# Patient Record
Sex: Male | Born: 1965 | Race: White | Hispanic: No | Marital: Single | Smoking: Former smoker
Health system: Southern US, Community
[De-identification: ages and names within clinical notes are randomized; demographics above are authoritative.]

## PROBLEM LIST (undated history)

## (undated) DIAGNOSIS — F419 Anxiety disorder, unspecified: Secondary | ICD-10-CM

## (undated) DIAGNOSIS — A809 Acute poliomyelitis, unspecified: Secondary | ICD-10-CM

## (undated) DIAGNOSIS — M1990 Unspecified osteoarthritis, unspecified site: Secondary | ICD-10-CM

## (undated) DIAGNOSIS — E119 Type 2 diabetes mellitus without complications: Secondary | ICD-10-CM

## (undated) DIAGNOSIS — E7800 Pure hypercholesterolemia, unspecified: Secondary | ICD-10-CM

## (undated) DIAGNOSIS — K5732 Diverticulitis of large intestine without perforation or abscess without bleeding: Secondary | ICD-10-CM

## (undated) DIAGNOSIS — E669 Obesity, unspecified: Secondary | ICD-10-CM

## (undated) HISTORY — PX: EYE SURGERY: SHX253

## (undated) HISTORY — DX: Unspecified osteoarthritis, unspecified site: M19.90

## (undated) HISTORY — DX: Acute poliomyelitis, unspecified: A80.9

## (undated) HISTORY — DX: Obesity, unspecified: E66.9

## (undated) HISTORY — DX: Pure hypercholesterolemia, unspecified: E78.00

## (undated) HISTORY — DX: Type 2 diabetes mellitus without complications: E11.9

## (undated) HISTORY — DX: Diverticulitis of large intestine without perforation or abscess without bleeding: K57.32

## (undated) HISTORY — DX: Anxiety disorder, unspecified: F41.9

## (undated) HISTORY — PX: COLON SURGERY: SHX602

---
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
## 2000-04-20 ENCOUNTER — Emergency Department (HOSPITAL_COMMUNITY): Admission: EM | Admit: 2000-04-20 | Discharge: 2000-04-20 | Payer: Self-pay | Admitting: Emergency Medicine

## 2000-04-20 ENCOUNTER — Encounter: Payer: Self-pay | Admitting: Emergency Medicine

## 2003-07-19 ENCOUNTER — Encounter: Admission: RE | Admit: 2003-07-19 | Discharge: 2003-07-19 | Payer: Self-pay | Admitting: Gastroenterology

## 2003-08-15 ENCOUNTER — Ambulatory Visit (HOSPITAL_COMMUNITY): Admission: RE | Admit: 2003-08-15 | Discharge: 2003-08-15 | Payer: Self-pay | Admitting: Gastroenterology

## 2003-08-15 ENCOUNTER — Encounter (INDEPENDENT_AMBULATORY_CARE_PROVIDER_SITE_OTHER): Payer: Self-pay | Admitting: *Deleted

## 2005-04-14 ENCOUNTER — Emergency Department (HOSPITAL_COMMUNITY): Admission: EM | Admit: 2005-04-14 | Discharge: 2005-04-14 | Payer: Self-pay | Admitting: Emergency Medicine

## 2007-01-06 ENCOUNTER — Encounter: Admission: RE | Admit: 2007-01-06 | Discharge: 2007-01-06 | Payer: Self-pay | Admitting: Gastroenterology

## 2008-05-25 HISTORY — PX: HEMICOLECTOMY: SHX854

## 2008-06-06 ENCOUNTER — Encounter (INDEPENDENT_AMBULATORY_CARE_PROVIDER_SITE_OTHER): Payer: Self-pay | Admitting: General Surgery

## 2008-06-06 ENCOUNTER — Inpatient Hospital Stay (HOSPITAL_COMMUNITY): Admission: RE | Admit: 2008-06-06 | Discharge: 2008-06-12 | Payer: Self-pay | Admitting: General Surgery

## 2009-06-04 ENCOUNTER — Emergency Department (HOSPITAL_COMMUNITY): Admission: EM | Admit: 2009-06-04 | Discharge: 2009-06-04 | Payer: Self-pay | Admitting: Emergency Medicine

## 2010-08-10 LAB — CBC
Hemoglobin: 15.6 g/dL (ref 13.0–17.0)
MCHC: 34.7 g/dL (ref 30.0–36.0)
MCV: 85.4 fL (ref 78.0–100.0)
RBC: 5.24 MIL/uL (ref 4.22–5.81)
RDW: 13.1 % (ref 11.5–15.5)

## 2010-08-10 LAB — BASIC METABOLIC PANEL
CO2: 24 mEq/L (ref 19–32)
Chloride: 105 mEq/L (ref 96–112)
Creatinine, Ser: 0.9 mg/dL (ref 0.4–1.5)
GFR calc Af Amer: >60 mL/min (ref >60)
Glucose, Bld: 114 mg/dL — ABNORMAL HIGH (ref 70–99)
Sodium: 138 mEq/L (ref 135–145)

## 2010-08-10 LAB — RAPID URINE DRUG SCREEN, HOSP PERFORMED
Barbiturates: NOT DETECTED
Benzodiazepines: NOT DETECTED
Cocaine: POSITIVE — AB

## 2010-08-10 LAB — URINALYSIS, ROUTINE W REFLEX MICROSCOPIC
Glucose, UA: NEGATIVE mg/dL
Ketones, ur: 15 mg/dL — AB
Nitrite: NEGATIVE
pH: 5 (ref 5.0–8.0)

## 2010-08-10 LAB — DIFFERENTIAL
Basophils Absolute: 0 10*3/uL (ref 0.0–0.1)
Basophils Relative: 0 % (ref 0–1)
Eosinophils Absolute: 0 10*3/uL (ref 0.0–0.7)
Monocytes Absolute: 0.4 10*3/uL (ref 0.1–1.0)
Monocytes Relative: 4 % (ref 3–12)

## 2010-09-08 LAB — CBC
HCT: 36.1 % — ABNORMAL LOW (ref 39.0–52.0)
HCT: 44.4 % (ref 39.0–52.0)
HCT: 34.3 % — ABNORMAL LOW (ref 39.0–52.0)
Hemoglobin: 12.5 g/dL — ABNORMAL LOW (ref 13.0–17.0)
MCHC: 34.2 g/dL (ref 30.0–36.0)
MCV: 85.8 fL (ref 78.0–100.0)
MCV: 85.1 fL (ref 78.0–100.0)
Platelets: 190 10*3/uL (ref 150–400)
Platelets: 190 10*3/uL (ref 150–400)
RDW: 13.1 % (ref 11.5–15.5)
RDW: 13.4 % (ref 11.5–15.5)
WBC: 8.3 10*3/uL (ref 4.0–10.5)

## 2010-09-08 LAB — DIFFERENTIAL
Basophils Absolute: 0 10*3/uL (ref 0.0–0.1)
Lymphocytes Relative: 43 % (ref 12–46)
Lymphs Abs: 2.7 10*3/uL (ref 0.7–4.0)
Monocytes Absolute: 0.4 10*3/uL (ref 0.1–1.0)
Monocytes Relative: 7 % (ref 3–12)
Neutro Abs: 2.8 10*3/uL (ref 1.7–7.7)

## 2010-09-08 LAB — URINALYSIS, ROUTINE W REFLEX MICROSCOPIC
Bilirubin Urine: NEGATIVE
Glucose, UA: NEGATIVE mg/dL
Nitrite: NEGATIVE
Specific Gravity, Urine: 1.027 (ref 1.005–1.030)
pH: 5.5 (ref 5.0–8.0)

## 2010-09-08 LAB — BASIC METABOLIC PANEL
BUN: 5 mg/dL — ABNORMAL LOW (ref 6–23)
Chloride: 107 mEq/L (ref 96–112)
GFR calc non Af Amer: >60 mL/min (ref >60)
Glucose, Bld: 117 mg/dL — ABNORMAL HIGH (ref 70–99)
Glucose, Bld: 128 mg/dL — ABNORMAL HIGH (ref 70–99)
Potassium: 4.3 mEq/L (ref 3.5–5.1)
Potassium: 4.5 mEq/L (ref 3.5–5.1)
Sodium: 137 mEq/L (ref 135–145)

## 2010-09-08 LAB — COMPREHENSIVE METABOLIC PANEL
Albumin: 4.2 g/dL (ref 3.5–5.2)
BUN: 11 mg/dL (ref 6–23)
Calcium: 9.6 mg/dL (ref 8.4–10.5)
Creatinine, Ser: 0.97 mg/dL (ref 0.4–1.5)
Total Bilirubin: 0.6 mg/dL (ref 0.3–1.2)
Total Protein: 6.9 g/dL (ref 6.0–8.3)

## 2010-10-07 NOTE — Op Note (Signed)
NAMEKINGDOM, VANZANTEN              ACCOUNT NO.:  [id number]   MEDICAL RECORD NO.:  [id number]          PATIENT TYPE:  INP   LOCATION:  NA                           FACILITY:  [facility]   PHYSICIAN:  Sharlet Salina T. Hoxworth, M.D.DATE OF BIRTH:  1966-05-02   DATE OF PROCEDURE:  06/06/2008  DATE OF DISCHARGE:                               OPERATIVE REPORT   PREOPERATIVE DIAGNOSIS:  Diverticulitis, left colon.   POSTOPERATIVE DIAGNOSIS:  Diverticulitis, left colon.   SURGICAL PROCEDURES:  Laparoscopy and open left hemicolectomy with take-  down of splenic flexure.   SURGEON:  Lorne Skeens. Hoxworth, M.D.   ASSISTANT:  Dr. Felicity Pellegrini   ANESTHESIA:  General.   BRIEF HISTORY:  Clayton Thompson is a 45 year old male who has been having  recurrent episodes of diverticulitis for at least 15 years.  He has  begun to have more frequent episodes with left lower quadrant pain and  documented sigmoid diverticulitis by CT.  Over the last year he has been  on almost constant courses of oral antibiotics and will have a flare-up  almost as soon as he is off.  He had a colonoscopy within the last 2  years, consistent with diverticulitis of the left colon.  CT scan,  January 06, 2008, showed sigmoid colon diverticulitis without abscess or  other complication.  After discussion in the office, we have elected to  proceed with colectomy to control his symptoms and complications.  We  will approach this initially laparoscopically.  The nature of the  procedure, its indications, risks of anesthetic complications, bleeding,  infection, anastomotic leak and possible need for open procedure were  discussed and understood.  He is now brought to the operating room for  this procedure.   DESCRIPTION OF OPERATION:  The patient was brought to the operating  room, placed in supine position on the operating table and general  endotracheal anesthesia was induced.  He was carefully positioned,  padded in lithotomy  position.  Foley catheter was placed.  Abdomen and  perineum were widely sterilely prepped and draped.  He had received  preoperative broad-spectrum antibiotics.  Mechanical and antibiotic  bowel prep had been done at home.  Correct patient and procedure were  verified.   Access was obtained with a 5 mm OptiVu trocar in the right lateral  abdomen without difficulty and pneumoperitoneum established.  Under  direct vision an 11-mm trocar was placed in the midline above the  umbilicus, another 5-mm trocar in the left lateral abdomen.  There was  very significant diverticulitis involving the sigmoid colon with a very  thickened, inflamed-appearing sigmoid colon and inflammatory adhesions  to the lateral and anterior abdominal wall.  Examination of the left  colon laparoscopically also showed significant diverticulosis extending  up to and involving the splenic flexure and there appeared to be  muscular thickening of the majority of the proximal left colon.  There  was relatively normal-appearing distal rectosigmoid.   Initially I approached the mesentery of the sigmoid colon medially,  incising the peritoneum at the base of the mesentery on the medial  aspect from  the sacral promontory up along the sigmoid and left colon.  Dissection was then carried into the retroperitoneum, mobilizing the  mesentery up out of the retroperitoneum.  However, the tissue was very  thick, appeared somewhat friable and chronically inflamed and the  dissection planes were really difficult to identify.  I therefore  elected to mobilize the colon laterally and inflammatory adhesions and  then more peritoneal attachments were taken down laterally with cautery  and the sigmoid was mobilized toward the midline.  This dissection was  carried up toward the left colon.  A gel hand port was placed in the low  midline through a 7-cm incision to allow more mobilization of the  proximal left colon.  We then went up to the  splenic flexure where there  was still some significant diverticulosis and I felt at this point we  would need to resect him back to the midtransverse colon.  The lesser  sac was opened with the LigaSure and the omentum mobilized up off the  distal transverse colon.  We then worked on mobilizing the splenic  flexure laparoscopically.  However, this was very difficult with a very  high splenic flexure that actually extended up behind the spleen.  As we  rotated the spleen up there were attachments that really extended almost  up toward the diaphragm.  We worked on this for some time and achieved  some mobilization under direct vision, but I eventually felt that we  really could not see or manipulate the tissue well enough to safely  mobilize the distal transverse colon and splenic flexure.  Also the  dissection around the sigmoid mesentery had not been completed and  appeared to be quite difficult and I felt at this point that converting  to open procedure was wisest.   Therefore the Gel Port was removed and the incision was extended in the  midline up above the umbilicus.  Following this, the colon was further  mobilized, completely taking down the splenic flexure under direct  vision with the cautery and careful blunt dissection and the distal  transverse colon fully mobilized, dividing some attachments to the  lesser sac.  The left colon was further mobilized, working distally, and  finally the rectosigmoid was fully mobilized up out of the  retroperitoneum.  The ureter was identified and protected.  We  identified the distal sigmoid or rectosigmoid where the tinea began to  splay where the tissue was very soft.  We did not see any further  diverticula.  This was chosen for the distal point of resection.  I  chose the midtransverse colon just distal to the middle colic vessels  for the proximal point of resection and this was cleaned of mesenteric  pericolic fat, divided with GIA  stapler.  The mesentery of the distal  transverse left sigmoid colon was then sequentially divided with the  LigaSure.  Larger vessels were additionally clamped and tied proximally.  The ureter was again identified as we worked distally and dissection was  carried down to the previously-marked normal rectosigmoid at which point  the bowel was divided between [employer] clamp and the specimen  removed.   The retroperitoneum was inspected for hemostasis.  We then brought down  the transverse colon with anastomosis which reached very nicely.  I  elected to do a Baker type anastomosis with full-thickness 2-0 inverting  silks between the side of the transverse colon and the end of the  rectosigmoid.  This was  accomplished nicely with a widely patent  anastomosis with healthy-appearing tissue with good blood supply and  there was no tension.  At completion of anastomosis, gloves and  instruments were changed and the abdomen was copiously irrigated with  several liters of saline and the mesentery, retroperitoneum and left  upper quadrant were carefully inspected for hemostasis which appeared  adequate.   The viscera were returned to anatomic position with the transverse colon  coming down, lying nicely lateral and posterior to the small bowel.  The  omentum was brought down over the viscera and the midline fascia was  closed with running looped #1 PDS, begun at either end of the incision  and tied centrally.  Subcutaneous tissue was irrigated.  Skin closed  with staples.  Sponge and needle counts correct.  The patient was taken  to recovery in good condition.      Lorne Skeens. Hoxworth, M.D.  Electronically Signed     BTH/MEDQ  D:  06/06/2008  T:  06/06/2008  Job:  [id number]

## 2010-10-10 NOTE — Discharge Summary (Signed)
NAMEROHAN, JUENGER              ACCOUNT NO.:  [id number]   MEDICAL RECORD NO.:  [id number]          PATIENT TYPE:  INP   LOCATION:  1529                         FACILITY:  [facility]   PHYSICIAN:  Sharlet Salina T. Hoxworth, M.D.DATE OF BIRTH:  12/20/1965   DATE OF ADMISSION:  06/06/2008  DATE OF DISCHARGE:  06/12/2008                               DISCHARGE SUMMARY   DISCHARGE DIAGNOSIS:  Chronic diverticulitis, left colon.   SURGICAL PROCEDURE:  Left hemicolectomy, June 06, 2008.   HISTORY OF PRESENT ILLNESS:  Clayton Thompson is a 45 year old male  followed by Dr. Evette Cristal for abdominal pain and diverticulitis.  This has  been an intermittent problem for him over the last 10 or 15 years.  He  has been treated for innumerable episodes of lower abdominal pain with  outpatient antibiotics which have gradually become more frequent and  severe.  He now is being treated every couple of months and has some  chronic ongoing discomfort even between acute episodes.  Colonoscopy by  Dr. Evette Cristal 2 years ago showed findings consistent with diverticulitis.  CT scan of the abdomen, January 06, 2008, showed sigmoid colon  diverticulitis without abscess or complication.  His bowel movements  have been generally reasonably normal.  No fever or chills but has  persistent left lower quadrant discomfort.  After consultation in the  office and discussion of options, we elected to proceed with elective  laparoscopic or open colectomy.   PAST MEDICAL HISTORY:  1. 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  2. He had a growth plate removed in the right leg but no other      surgery.   No current medications.  NO ALLERGIES.   SOCIAL:  See detailed H and P.   FAMILY HISTORY:  See detailed H and P.   REVIEW OF SYSTEMS:  See detailed H and P.   HOSPITAL COURSE:  Patient was admitted on the morning of his procedure  following mechanical and antibiotic bowel prep at home.  He underwent  laparoscopic exploration with findings of some diverticulosis,  diverticulitis, and thickening of the colon extending well up the left  side of the colon.  Due to his body habitus and extensive disease, this  was technically difficult and converted to an open procedure.  He  underwent a complete left hemicolectomy with anastomosis.  Pain control  and mobility were somewhat difficult postoperatively but he did not have  any significant complications.  He did have a moderate ileus with some  bloating and distention for the first several days.  He was on sips of  clear liquids.  By the 4th day, he had had a bowel movement, his nausea  was improved.  He was advanced to a full liquid diet.  By the 5th day,  he had had further bowel movements and felt much better.  Abdomen was  soft.  He was advanced to a regular diet.  He tolerated this well.  He  is felt ready for discharge on June 12, 2008, the 6th postoperative  day.  Wound is healing primarily.  Abdomen  is soft and nontender.   DISCHARGE MEDICATIONS:  Tylox for pain.   PATHOLOGY:  Revealed diverticulosis and diverticulitis.   FOLLOWUP:  My office in 2 weeks.      Lorne Skeens. Hoxworth, M.D.  Electronically Signed     BTH/MEDQ  D:  07/09/2008  T:  07/09/2008  Job:  [id number]   cc:   Graylin Shiver, M.D.  Fax: [phone]

## 2010-10-10 NOTE — Op Note (Signed)
NAME:  Clayton Thompson, Clayton Thompson                        ACCOUNT NO.:  [id number]   MEDICAL RECORD NO.:  [id number]                   PATIENT TYPE:  AMB   LOCATION:  ENDO                                 FACILITY:  [facility]   PHYSICIAN:  Graylin Shiver, M.D.                DATE OF BIRTH:  [birth year]   DATE OF PROCEDURE:  08/15/2003  DATE OF DISCHARGE:                                 OPERATIVE REPORT   PROCEDURE:  Colonoscopy with polypectomy.   INDICATIONS:  This patient is a 45 year old male who has had recurrent  episodes of left lower quadrant abdominal pain.  He was recently diagnosed  with diverticulitis and has been treated with antibiotics.  Colonoscopy is  being done to further evaluate the colon.   Informed consent was obtained after explanation of the risks of bleeding,  infection, and perforation.   MEDICATIONS:  Fentanyl 100 mcg IV, Versed 10 mg IV.   PROCEDURE:  With the patient in the left lateral decubitus position, a  rectal exam was performed.  No masses were felt.  The Olympus colonoscope  was inserted into the rectum and advanced around the colon to the cecum.  Cecal landmarks were identified.  The cecum and ascending colon were normal.  The transverse colon was normal.  The descending colon and sigmoid revealed  diverticulosis.  These were most extensive in the sigmoid region. In the  distal descending colon there was a 5-6 mm slightly pedunculated polyp that  was snared and removed by snare cautery technique.  The polyp was retrieved,  the cautery site looked good.  The rectum looked normal.  He tolerated the  procedure well without complications.   IMPRESSION:  1. Diverticulosis of the sigmoid and descending colon.  I saw no evidence of     severe active diverticulitis at this time.  2. Distal descending colon polyp.   PLAN:  The pathology will be checked.  The patient will be followed up in  the office.                                               Graylin Shiver,  M.D.    Germain Osgood  D:  08/15/2003  T:  08/16/2003  Job:  [id number]   cc:   L. Lupe Carney, M.D.  [address] [place]  [place] [address]  Fax: [phone]

## 2011-01-07 ENCOUNTER — Emergency Department (HOSPITAL_COMMUNITY)
Admission: EM | Admit: 2011-01-07 | Discharge: 2011-01-08 | Disposition: A | Discharge status: Home / Self Care | Payer: Self-pay | Attending: Emergency Medicine | Admitting: Emergency Medicine

## 2011-01-07 DIAGNOSIS — R079 Chest pain, unspecified: Secondary | ICD-10-CM | POA: Insufficient documentation

## 2011-01-07 DIAGNOSIS — M79609 Pain in unspecified limb: Secondary | ICD-10-CM | POA: Insufficient documentation

## 2011-01-07 DIAGNOSIS — F3289 Other specified depressive episodes: Secondary | ICD-10-CM | POA: Insufficient documentation

## 2011-01-07 DIAGNOSIS — R55 Syncope and collapse: Secondary | ICD-10-CM | POA: Insufficient documentation

## 2011-01-07 DIAGNOSIS — F329 Major depressive disorder, single episode, unspecified: Secondary | ICD-10-CM | POA: Insufficient documentation

## 2011-01-07 LAB — CBC
MCHC: 35.8 g/dL (ref 30.0–36.0)
RDW: 13.2 % (ref 11.5–15.5)

## 2011-01-08 ENCOUNTER — Emergency Department (HOSPITAL_COMMUNITY): Payer: Self-pay

## 2011-01-08 LAB — COMPREHENSIVE METABOLIC PANEL
ALT: 52 U/L (ref 0–53)
Albumin: 4.2 g/dL (ref 3.5–5.2)
Alkaline Phosphatase: 62 U/L (ref 39–117)
BUN: 14 mg/dL (ref 6–23)
Potassium: 4.2 mEq/L (ref 3.5–5.1)
Sodium: 141 mEq/L (ref 135–145)
Total Protein: 7.6 g/dL (ref 6.0–8.3)

## 2011-01-08 LAB — CK TOTAL AND CKMB (NOT AT ARMC)
Relative Index: 1.6 (ref 0.0–2.5)
Total CK: 280 U/L — ABNORMAL HIGH (ref 7–232)

## 2011-01-08 LAB — TROPONIN I: Troponin I: <0.3 ng/mL (ref <0.30)

## 2013-06-02 ENCOUNTER — Other Ambulatory Visit: Payer: Self-pay | Admitting: Family Medicine

## 2013-06-02 DIAGNOSIS — R1032 Left lower quadrant pain: Secondary | ICD-10-CM

## 2013-06-07 ENCOUNTER — Ambulatory Visit
Admission: RE | Admit: 2013-06-07 | Discharge: 2013-06-07 | Disposition: A | Discharge status: Home / Self Care | Payer: No Typology Code available for payment source | Source: Ambulatory Visit | Attending: Family Medicine | Admitting: Family Medicine

## 2013-06-07 DIAGNOSIS — R1032 Left lower quadrant pain: Secondary | ICD-10-CM

## 2013-06-07 MED ORDER — IOHEXOL 300 MG/ML  SOLN
125.0000 mL | Freq: Once | INTRAMUSCULAR | Status: AC | PRN
  Administered 2013-06-07: 125 mL via INTRAVENOUS

## 2014-01-10 ENCOUNTER — Ambulatory Visit (HOSPITAL_COMMUNITY): Payer: BC Managed Care – PPO | Attending: Cardiovascular Disease | Admitting: Radiology

## 2014-01-10 VITALS — BP 138/95 | HR 64 | Ht 66.0 in | Wt 222.0 lb

## 2014-01-10 DIAGNOSIS — Z87891 Personal history of nicotine dependence: Secondary | ICD-10-CM | POA: Diagnosis not present

## 2014-01-10 DIAGNOSIS — R42 Dizziness and giddiness: Secondary | ICD-10-CM | POA: Insufficient documentation

## 2014-01-10 DIAGNOSIS — R079 Chest pain, unspecified: Secondary | ICD-10-CM

## 2014-01-10 MED ORDER — TECHNETIUM TC 99M SESTAMIBI GENERIC - CARDIOLITE
30.0000 | Freq: Once | INTRAVENOUS | Status: AC | PRN
  Administered 2014-01-10: 30 via INTRAVENOUS

## 2014-01-10 MED ORDER — TECHNETIUM TC 99M SESTAMIBI GENERIC - CARDIOLITE
10.0000 | Freq: Once | INTRAVENOUS | Status: AC | PRN
  Administered 2014-01-10: 10 via INTRAVENOUS

## 2014-01-10 MED ORDER — REGADENOSON 0.4 MG/5ML IV SOLN
0.4000 mg | Freq: Once | INTRAVENOUS | Status: AC
  Administered 2014-01-10: 0.4 mg via INTRAVENOUS

## 2014-01-10 NOTE — Progress Notes (Signed)
[facility] SITE 3 NUCLEAR MED [address] [place], [place] [address] [phone]    Cardiology Nuclear Med Study  Clayton JulyRobert T Thompson is a 48 y.o. male     MRN : [id number]     DOB: 16-Apr-1966  Procedure Date: 01/10/2014  Nuclear Med Background Indication for Stress Test:  Evaluation for Ischemia History:  No known CAD, CT showed narrowing cardiac arteries Cardiac Risk Factors: History of Smoking  Symptoms:  Chest Pain, Chest Pain with Exertion (last date of chest discomfort was earlier this morning) and Dizziness   Nuclear Pre-Procedure Caffeine/Decaff Intake:  None> 12 hrs NPO After: 7:45am   Lungs:  clear O2 Sat: 97% on room air. IV 0.9% NS with Angio Cath:  22g  IV Site: R Antecubital x 1, tolerated well IV Started by:  Irean HongPatsy Edwards, RN  Chest Size (in):  48 Cup Size: n/a  Height: 5\' 6"  (1.676 m)  Weight:  222 lb (100.699 kg)  BMI:  Body mass index is 35.85 kg/(m^2). Tech Comments: N/A    Nuclear Med Study 1 or 2 day study: 1 day  Stress Test Type:  Lexiscan  Reading MD: N/A  Order Authorizing Provider:  Lupe Carneyean Mitchell, MD  Resting Radionuclide: Technetium 2633m Sestamibi  Resting Radionuclide Dose: 11.0 mCi   Stress Radionuclide:  Technetium 5233m Sestamibi  Stress Radionuclide Dose: 33.0 mCi           Stress Protocol Rest HR: 64 Stress HR: 95  Rest BP: 138/95 Stress BP: 140/87  Exercise Time (min): n/a METS: n/a           Dose of Adenosine (mg):  n/a Dose of Lexiscan: 0.4 mg  Dose of Atropine (mg): n/a Dose of Dobutamine: n/a mcg/kg/min (at max HR)  Stress Test Technologist: Nelson ChimesSharon Brooks, BS-ES  Nuclear Technologist:  Harlow AsaElizabeth Young, CNMT     Rest Procedure:  Myocardial perfusion imaging was performed at rest 45 minutes following the intravenous administration of Technetium 1933m Sestamibi. Rest ECG: NSR - Normal EKG  Stress Procedure:  The patient received IV Lexiscan 0.4 mg over 15-seconds.  Technetium 3733m Sestamibi injected at 30-seconds.   Quantitative spect images were obtained after a 45 minute delay.  During the infusion of Lexiscan the patient complained of SOB, headache, dizziness and nausea.  These symptoms began to resolve in recovery.  Stress ECG: No significant change from baseline ECG  QPS Raw Data Images:  Mild diaphragmatic attenuation.  Normal left ventricular size. Stress Images:  There is decreased uptake in the inferior wall. Rest Images:  There is decreased uptake in the inferior wall. Subtraction (SDS):  There is a fixed inferior defect with a small area of reversibility in the inferoapex that is most consistent with variations in diaphragmatic attenuation between rest and stress images but cannot rule out a small are of ischemia. Transient Ischemic Dilatation (Normal <1.22):  1.34 Lung/Heart Ratio (Normal <0.45):  0.35  Quantitative Gated Spect Images QGS EDV:  133 ml QGS ESV:  73 ml  Impression Exercise Capacity:  Lexiscan with no exercise. BP Response:  Normal blood pressure response. Clinical Symptoms:  There is dyspnea. ECG Impression:  No significant ST segment change suggestive of ischemia. Comparison with Prior Nuclear Study: No images to compare  Overall Impression:  Intermediate risk stress nuclear study Fixed inferior defect with a small are of reversibility in the inferoapex most consistent with variations in diaphragmatic attenuation but cannot rule out a small area of ischemia. There is also evidence of  transient ischemic dilatation worrisome for multivessel CAD.Marland Kitchen  LV Ejection Fraction: 45%.  LV Wall Motion:  Mildly reduced LV Function; NL Wall Motion   Signed: Armanda Magic, MD

## 2014-01-24 ENCOUNTER — Ambulatory Visit: Payer: BC Managed Care – PPO | Admitting: Cardiology

## 2014-03-27 ENCOUNTER — Encounter: Payer: Self-pay | Admitting: Cardiology

## 2014-03-27 ENCOUNTER — Other Ambulatory Visit: Payer: Self-pay | Admitting: Cardiology

## 2014-03-27 ENCOUNTER — Other Ambulatory Visit: Payer: Self-pay

## 2014-03-27 ENCOUNTER — Ambulatory Visit (INDEPENDENT_AMBULATORY_CARE_PROVIDER_SITE_OTHER): Payer: BC Managed Care – PPO | Admitting: Cardiology

## 2014-03-27 VITALS — BP 134/90 | HR 66 | Ht 67.0 in | Wt 229.2 lb

## 2014-03-27 DIAGNOSIS — R079 Chest pain, unspecified: Secondary | ICD-10-CM

## 2014-03-27 DIAGNOSIS — R9439 Abnormal result of other cardiovascular function study: Secondary | ICD-10-CM

## 2014-03-27 DIAGNOSIS — I2510 Atherosclerotic heart disease of native coronary artery without angina pectoris: Secondary | ICD-10-CM | POA: Insufficient documentation

## 2014-03-27 DIAGNOSIS — Z01812 Encounter for preprocedural laboratory examination: Secondary | ICD-10-CM

## 2014-03-27 LAB — CBC WITH DIFFERENTIAL/PLATELET
Basophils Absolute: 0 10*3/uL (ref 0.0–0.1)
Basophils Relative: 0.4 % (ref 0.0–3.0)
Eosinophils Absolute: 0.3 10*3/uL (ref 0.0–0.7)
Eosinophils Relative: 3.9 % (ref 0.0–5.0)
HEMATOCRIT: 46.9 % (ref 39.0–52.0)
Hemoglobin: 15.6 g/dL (ref 13.0–17.0)
LYMPHS ABS: 2.7 10*3/uL (ref 0.7–4.0)
Lymphocytes Relative: 35.2 % (ref 12.0–46.0)
MCHC: 33.2 g/dL (ref 30.0–36.0)
MCV: 86 fl (ref 78.0–100.0)
MONOS PCT: 6 % (ref 3.0–12.0)
Monocytes Absolute: 0.5 10*3/uL (ref 0.1–1.0)
NEUTROS PCT: 54.5 % (ref 43.0–77.0)
Neutro Abs: 4.3 10*3/uL (ref 1.4–7.7)
PLATELETS: 213 10*3/uL (ref 150.0–400.0)
RBC: 5.46 Mil/uL (ref 4.22–5.81)
RDW: 13.7 % (ref 11.5–15.5)
WBC: 7.8 10*3/uL (ref 4.0–10.5)

## 2014-03-27 LAB — APTT: aPTT: 32.1 s (ref 23.4–32.7)

## 2014-03-27 LAB — BASIC METABOLIC PANEL
BUN: 13 mg/dL (ref 6–23)
CALCIUM: 9.5 mg/dL (ref 8.4–10.5)
CO2: 27 meq/L (ref 19–32)
Chloride: 106 mEq/L (ref 96–112)
Creatinine, Ser: 0.9 mg/dL (ref 0.4–1.5)
GFR: 91.54 mL/min (ref >60.00)
Glucose, Bld: 93 mg/dL (ref 70–99)
Potassium: 5.3 mEq/L — ABNORMAL HIGH (ref 3.5–5.1)
SODIUM: 139 meq/L (ref 135–145)

## 2014-03-27 LAB — PROTIME-INR
INR: 1 ratio (ref 0.8–1.0)
PROTHROMBIN TIME: 10.6 s (ref 9.6–13.1)

## 2014-03-27 NOTE — Progress Notes (Signed)
[address] [place], [place]  [address] Phone: [phone] Fax:  [phone]  Date:  03/27/2014   ID:  Clayton JulyRobert T Labrosse, DOB 1965/08/08, MRN [id number]  PCP:  No primary care provider on file.  Cardiologist:  Armanda Magicraci Khiley Lieser, MD    History of Present Illness: Clayton Thompson is a 48 y.o. male with no prior cardiac history who is referred here today for evaluation from PCP.  He apparently had a CT scan done last January to evaluate abdominal pain and it showed coronary artery calcifications.  He was told at that time to come in to discuss with his PCP but did not.  He was seen in July by his PCP and was complaining of exertional chest pain and dizziness.  A nuclear stress test was ordered which was done 01/10/2014.  This was intermediate risk with a fixed inferior defect with a very small area of reversibility in the inferoapex that was felt to most likely represent variations in diaphragmatic attenuation but could not rule out a very small area of ischemia.  There was also evidence of transient ischemic dilatation.  EF was noted to be 45%.  His PCP now has referred him for further cardiac workup.  He says that he will get a chest discomfort that is a pressure and tooth ache over his left breast that occurs a few times weekly and usually lasts about 30  Minutes to an hour and then resolves.  He denies any SOB but occasionally will get diaphoresis.     Wt Readings from Last 3 Encounters:  03/27/14 229 lb 3.2 oz (103.964 kg)  01/10/14 222 lb (100.699 kg)     No past medical history on file.  Current Outpatient Prescriptions  Medication Sig Dispense Refill  . ALPRAZolam (XANAX) 0.25 MG tablet Take 0.25 mg by mouth daily as needed.  0   No current facility-administered medications for this visit.    Allergies:   No Known Allergies  Social History:  The patient     Family History:  The patient's family history is not on file.   ROS:  Please see the history of present illness.       All other systems reviewed and negative.   PHYSICAL EXAM: VS:  Ht 5\' 7"  (1.702 m)  Wt 229 lb 3.2 oz (103.964 kg)  BMI 35.89 kg/m2 Well nourished, well developed, in no acute distress HEENT: normal Neck: no JVD Cardiac:  normal S1, S2; RRR; no murmur Lungs:  clear to auscultation bilaterally, no wheezing, rhonchi or rales Abd: soft, nontender, no hepatomegaly Ext: no edema Skin: warm and dry Neuro:  CNs 2-12 intact, no focal abnormalities noted  EKG: NSR with no ST changes      ASSESSMENT AND PLAN:  1.  Chest pain with intermediate risk nuclear stress test with intermediate risk stress nuclear study with fixed inferior defect with a small are of reversibility in the inferoapex most consistent with variations in diaphragmatic attenuation but could not rule out a small area of ischemia. There was also evidence of transient ischemic dilatation worrisome for multivessel CAD.  His CRF include remote tobacco use, male sex and age.  I am concerned about his symptoms and with abnormal stress test and coronary artery calcifications noted on chest CT, I have recommended that we proceed with cardiac cath.  We will schedule for this week.  He will continue on ASA.  Cardiac catheterization was discussed with the patient fully including risks on myocardial  infarction, death, stroke, bleeding, arrhythmia, dye allergy, renal insufficiency or bleeding.  All patient questions and concerns were discussed and the patient understands and is willing to proceed.   2.  Coronary artery calcifications on chest CT 3.  Remote history of tobacco  Signed, Armanda Magicraci Allie Ousley, MD [facility] 03/27/2014 11:49 AM

## 2014-03-27 NOTE — Patient Instructions (Signed)
Your physician has requested that you have a cardiac catheterization. Cardiac catheterization is used to diagnose and/or treat various heart conditions. Doctors may recommend this procedure for a number of different reasons. The most common reason is to evaluate chest pain. Chest pain can be a symptom of coronary artery disease (CAD), and cardiac catheterization can show whether plaque is narrowing or blocking your heart's arteries. This procedure is also used to evaluate the valves, as well as measure the blood flow and oxygen levels in different parts of your heart. For further information please visit [email or web address]. Please follow instruction sheet, as given.  Your physician recommends that you have lab work TODAY (CBC, BMET, PT, PTT)

## 2014-03-28 ENCOUNTER — Ambulatory Visit (HOSPITAL_COMMUNITY)
Admission: RE | Admit: 2014-03-28 | Discharge: 2014-03-28 | Disposition: A | Discharge status: Home / Self Care | Payer: BC Managed Care – PPO | Source: Ambulatory Visit | Attending: Cardiovascular Disease | Admitting: Cardiovascular Disease

## 2014-03-28 ENCOUNTER — Encounter (HOSPITAL_COMMUNITY)
Admission: RE | Disposition: A | Discharge status: Home / Self Care | Payer: Self-pay | Source: Ambulatory Visit | Attending: Cardiovascular Disease

## 2014-03-28 DIAGNOSIS — I2510 Atherosclerotic heart disease of native coronary artery without angina pectoris: Secondary | ICD-10-CM

## 2014-03-28 DIAGNOSIS — R079 Chest pain, unspecified: Secondary | ICD-10-CM | POA: Diagnosis present

## 2014-03-28 DIAGNOSIS — Z87891 Personal history of nicotine dependence: Secondary | ICD-10-CM | POA: Diagnosis not present

## 2014-03-28 DIAGNOSIS — R931 Abnormal findings on diagnostic imaging of heart and coronary circulation: Secondary | ICD-10-CM

## 2014-03-28 HISTORY — PX: LEFT HEART CATHETERIZATION WITH CORONARY ANGIOGRAM: SHX5451

## 2014-03-28 LAB — POTASSIUM: POTASSIUM: 5.3 meq/L (ref 3.7–5.3)

## 2014-03-28 SURGERY — LEFT HEART CATHETERIZATION WITH CORONARY ANGIOGRAM
Anesthesia: LOCAL

## 2014-03-28 MED ORDER — ASPIRIN 81 MG PO CHEW
CHEWABLE_TABLET | ORAL | Status: AC
  Administered 2014-03-28: 81 mg via ORAL
  Filled 2014-03-28: qty 1

## 2014-03-28 MED ORDER — FENTANYL CITRATE 0.05 MG/ML IJ SOLN
INTRAMUSCULAR | Status: AC
  Filled 2014-03-28: qty 2

## 2014-03-28 MED ORDER — MIDAZOLAM HCL 2 MG/2ML IJ SOLN
INTRAMUSCULAR | Status: AC
  Filled 2014-03-28: qty 2

## 2014-03-28 MED ORDER — SODIUM CHLORIDE 0.9 % IV SOLN: 250.0000 mL | INTRAVENOUS | Status: DC | PRN

## 2014-03-28 MED ORDER — HEPARIN (PORCINE) IN NACL 2-0.9 UNIT/ML-% IJ SOLN
INTRAMUSCULAR | Status: AC
  Filled 2014-03-28: qty 500

## 2014-03-28 MED ORDER — NITROGLYCERIN 1 MG/10 ML FOR IR/CATH LAB
INTRA_ARTERIAL | Status: AC
  Filled 2014-03-28: qty 10

## 2014-03-28 MED ORDER — SODIUM CHLORIDE 0.9 % IV SOLN: 1.0000 mL/kg/h | INTRAVENOUS | Status: DC

## 2014-03-28 MED ORDER — HEPARIN SODIUM (PORCINE) 1000 UNIT/ML IJ SOLN
INTRAMUSCULAR | Status: AC
  Filled 2014-03-28: qty 1

## 2014-03-28 MED ORDER — LIDOCAINE HCL (PF) 1 % IJ SOLN
INTRAMUSCULAR | Status: AC
  Filled 2014-03-28: qty 30

## 2014-03-28 MED ORDER — SODIUM CHLORIDE 0.9 % IJ SOLN: 3.0000 mL | Freq: Two times a day (BID) | INTRAMUSCULAR | Status: DC

## 2014-03-28 MED ORDER — ASPIRIN 81 MG PO CHEW
81.0000 mg | CHEWABLE_TABLET | ORAL | Status: AC
  Administered 2014-03-28: 81 mg via ORAL

## 2014-03-28 MED ORDER — VERAPAMIL HCL 2.5 MG/ML IV SOLN
INTRAVENOUS | Status: AC
  Filled 2014-03-28: qty 2

## 2014-03-28 MED ORDER — SODIUM CHLORIDE 0.9 % IV SOLN
INTRAVENOUS | Status: DC
  Administered 2014-03-28: 09:00:00 via INTRAVENOUS

## 2014-03-28 MED ORDER — ACETAMINOPHEN 325 MG PO TABS: 650.0000 mg | ORAL_TABLET | ORAL | Status: DC | PRN

## 2014-03-28 MED ORDER — ONDANSETRON HCL 4 MG/2ML IJ SOLN: 4.0000 mg | Freq: Four times a day (QID) | INTRAMUSCULAR | Status: DC | PRN

## 2014-03-28 MED ORDER — HEPARIN (PORCINE) IN NACL 2-0.9 UNIT/ML-% IJ SOLN
INTRAMUSCULAR | Status: AC
  Filled 2014-03-28: qty 1000

## 2014-03-28 MED ORDER — SODIUM CHLORIDE 0.9 % IJ SOLN: 3.0000 mL | INTRAMUSCULAR | Status: DC | PRN

## 2014-03-28 NOTE — H&P (View-Only) (Signed)
[address] [place], [place]  [address] Phone: [phone] Fax:  [phone]  Date:  03/27/2014   ID:  Clayton Thompson, DOB 1965/08/08, MRN [id number]  PCP:  No primary care provider on file.  Cardiologist:  Armanda Magicraci Turner, MD    History of Present Illness: Clayton Thompson is a 48 y.o. male with no prior cardiac history who is referred here today for evaluation from PCP.  He apparently had a CT scan done last January to evaluate abdominal pain and it showed coronary artery calcifications.  He was told at that time to come in to discuss with his PCP but did not.  He was seen in July by his PCP and was complaining of exertional chest pain and dizziness.  A nuclear stress test was ordered which was done 01/10/2014.  This was intermediate risk with a fixed inferior defect with a very small area of reversibility in the inferoapex that was felt to most likely represent variations in diaphragmatic attenuation but could not rule out a very small area of ischemia.  There was also evidence of transient ischemic dilatation.  EF was noted to be 45%.  His PCP now has referred him for further cardiac workup.  He says that he will get a chest discomfort that is a pressure and tooth ache over his left breast that occurs a few times weekly and usually lasts about 30  Minutes to an hour and then resolves.  He denies any SOB but occasionally will get diaphoresis.     Wt Readings from Last 3 Encounters:  03/27/14 229 lb 3.2 oz (103.964 kg)  01/10/14 222 lb (100.699 kg)     No past medical history on file.  Current Outpatient Prescriptions  Medication Sig Dispense Refill  . ALPRAZolam (XANAX) 0.25 MG tablet Take 0.25 mg by mouth daily as needed.  0   No current facility-administered medications for this visit.    Allergies:   No Known Allergies  Social History:  The patient     Family History:  The patient's family history is not on file.   ROS:  Please see the history of present illness.       All other systems reviewed and negative.   PHYSICAL EXAM: VS:  Ht 5\' 7"  (1.702 m)  Wt 229 lb 3.2 oz (103.964 kg)  BMI 35.89 kg/m2 Well nourished, well developed, in no acute distress HEENT: normal Neck: no JVD Cardiac:  normal S1, S2; RRR; no murmur Lungs:  clear to auscultation bilaterally, no wheezing, rhonchi or rales Abd: soft, nontender, no hepatomegaly Ext: no edema Skin: warm and dry Neuro:  CNs 2-12 intact, no focal abnormalities noted  EKG: NSR with no ST changes      ASSESSMENT AND PLAN:  1.  Chest pain with intermediate risk nuclear stress test with intermediate risk stress nuclear study with fixed inferior defect with a small are of reversibility in the inferoapex most consistent with variations in diaphragmatic attenuation but could not rule out a small area of ischemia. There was also evidence of transient ischemic dilatation worrisome for multivessel CAD.  His CRF include remote tobacco use, male sex and age.  I am concerned about his symptoms and with abnormal stress test and coronary artery calcifications noted on chest CT, I have recommended that we proceed with cardiac cath.  We will schedule for this week.  He will continue on ASA.  Cardiac catheterization was discussed with the patient fully including risks on myocardial  infarction, death, stroke, bleeding, arrhythmia, dye allergy, renal insufficiency or bleeding.  All patient questions and concerns were discussed and the patient understands and is willing to proceed.   2.  Coronary artery calcifications on chest CT 3.  Remote history of tobacco  Signed, Traci Turner, MD [facility] 03/27/2014 11:49 AM  

## 2014-03-28 NOTE — Interval H&P Note (Signed)
History and Physical Interval Note:  03/28/2014 9:24 AM  Clayton Thompson  has presented today for surgery, with the diagnosis of abnormal nuclear stress test, cp  The various methods of treatment have been discussed with the patient and family. After consideration of risks, benefits and other options for treatment, the patient has consented to  Procedure(s): LEFT HEART CATHETERIZATION WITH CORONARY ANGIOGRAM (N/A) as a surgical intervention .  The patient's history has been reviewed, patient examined, no change in status, stable for surgery.  I have reviewed the patient's chart and labs.  Questions were answered to the patient's satisfaction.    Cath Lab Visit (complete for each Cath Lab visit)  Clinical Evaluation Leading to the Procedure:   ACS: No.  Non-ACS:    Anginal Classification: CCS II  Anti-ischemic medical therapy: No Therapy  Non-Invasive Test Results: Intermediate-risk stress test findings: cardiac mortality 1-3%/year  Prior CABG: No previous CABG       [name]

## 2014-03-28 NOTE — CV Procedure (Signed)
    Cardiac Catheterization Procedure Note  Name: Clayton JulyRobert T Suares MRN: [id number] DOB: [birth year]  Procedure: Left Heart Cath, Selective Coronary Angiography, LV angiography  Indication: Chest pain, abnormal Myoview scan   Procedural Details: The right wrist was prepped, draped, and anesthetized with 1% lidocaine. Using the modified Seldinger technique, a 5/6 French Slender sheath was introduced into the right radial artery. 3 mg of verapamil was administered through the sheath, weight-based unfractionated heparin was administered intravenously. Standard Judkins catheters were used for selective coronary angiography and left ventriculography. Catheter exchanges were performed over an exchange length guidewire. There were no immediate procedural complications. A TR band was used for radial hemostasis at the completion of the procedure.  The patient was transferred to the post catheterization recovery area for further monitoring.  Procedural Findings: Hemodynamics: AO 127/86 LV 134/19  Coronary angiography: Coronary dominance: right  Left mainstem: the left mainstem is patent without obstruction. The vessel divides into the LAD and left circumflex.  Left anterior descending (LAD): the LAD is patent. The vessel wraps around the left ventricular apex. The proximal vessel has minor irregularity. The mid vessel has 30-40% stenosis with diffuse plaquing. The distal vessel is widely patent. The diagonal branches are small in caliber and widely patent.  Left circumflex (LCx): the left circumflex supplies a relatively small distribution. The first obtuse marginal branch divides into multiple subbranches without stenosis.  Right coronary artery (RCA): this is a large, dominant vessel. The proximal vessel is widely patent. The mid vessel has a 50% stenosis. The distal vessel is patent. The PDA and PLA branches are patent.  Left ventriculography: Left ventricular systolic function is normal, LVEF is  estimated at 55-65%, there is no significant mitral regurgitation   Contrast: 70 cc Omnipaque  Estimated Blood Loss: minimal  Final Conclusions:   1. 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 Widely patent left main and left circumflex 3. Normal LV systolic function.  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 Would favor aggressive medical therapy for secondary risk reduction.  Tonny BollmanMichael Tad Fancher MD, [facility] 03/28/2014, 10:43 AM

## 2014-03-28 NOTE — Discharge Instructions (Signed)
Radial Site Care °Refer to this sheet in the next few weeks. These instructions provide you with information on caring for yourself after your procedure. Your caregiver may also give you more specific instructions. Your treatment has been planned according to current medical practices, but problems sometimes occur. Call your caregiver if you have any problems or questions after your procedure. °HOME CARE INSTRUCTIONS °· You may shower the day after the procedure. Remove the bandage (dressing) and gently wash the site with plain soap and water. Gently pat the site dry. °· Do not apply powder or lotion to the site. °· Do not submerge the affected site in water for 3 to 5 days. °· Inspect the site at least twice daily. °· Do not flex or bend the affected arm for 24 hours. °· No lifting over 5 pounds (2.3 kg) for 5 days after your procedure. °· Do not drive home if you are discharged the same day of the procedure. Have someone else drive you. °· You may drive 24 hours after the procedure unless otherwise instructed by your caregiver. °· Do not operate machinery or power tools for 24 hours. °· A responsible adult should be with you for the first 24 hours after you arrive home. °What to expect: °· Any bruising will usually fade within 1 to 2 weeks. °· Blood that collects in the tissue (hematoma) may be painful to the touch. It should usually decrease in size and tenderness within 1 to 2 weeks. °SEEK IMMEDIATE MEDICAL CARE IF: °· You have unusual pain at the radial site. °· You have redness, warmth, swelling, or pain at the radial site. °· You have drainage (other than a small amount of blood on the dressing). °· You have chills. °· You have a fever or persistent symptoms for more than 72 hours. °· You have a fever and your symptoms suddenly get worse. °· Your arm becomes pale, cool, tingly, or numb. °· You have heavy bleeding from the site. Hold pressure on the site. °Document Released: 06/13/2010 Document Revised:  08/03/2011 Document Reviewed: 06/13/2010 °ExitCare® Patient Information ©2015 [employer]. This information is not intended to replace advice given to you by your health care provider. Make sure you discuss any questions you have with your health care provider. ° °

## 2014-03-28 NOTE — Progress Notes (Signed)
TR BAND REMOVAL  LOCATION:    right radial  DEFLATED PER PROTOCOL:    Yes.    TIME BAND OFF / DRESSING APPLIED:    1240   SITE UPON ARRIVAL:    Level 0  SITE AFTER BAND REMOVAL:    Level 0  CIRCULATION SENSATION AND MOVEMENT:    Within Normal Limits   Yes.    COMMENTS:   BAND REMOVED/ TEGADERM APPLIED

## 2014-04-04 ENCOUNTER — Telehealth: Payer: Self-pay | Admitting: Cardiology

## 2014-04-04 NOTE — Telephone Encounter (Signed)
Left message to call back  

## 2014-04-04 NOTE — Telephone Encounter (Signed)
Patient had recent heart cath showing mild nonobstructive ASCAD.  His last LDL was 145 and his goal is <70.  Please have patient start Liptor 40mg  daily and recheck FLP and ALT in 6 weeks

## 2014-04-09 NOTE — Telephone Encounter (Signed)
Informed patient of cath results.   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 He is willing to start something, but not Lipitor.  Forwarding to Dr. Mayford Knifeurner for review.

## 2014-04-09 NOTE — Telephone Encounter (Signed)
Please have him set up in lipid clinic

## 2014-04-10 NOTE — Telephone Encounter (Signed)
Left message to call back  

## 2014-04-11 NOTE — Telephone Encounter (Signed)
Patient politely refuses referral to Lipid Clinic at this time.  He states he will talk to his pharmacist and his Polio Support Group to come up with solutions and he will call us back if he decided to follow through with Lipid Clinic.

## 2014-05-03 ENCOUNTER — Encounter (HOSPITAL_COMMUNITY): Payer: Self-pay | Admitting: Cardiovascular Disease

## 2014-10-15 ENCOUNTER — Other Ambulatory Visit: Payer: Self-pay | Admitting: Family Medicine

## 2014-10-15 DIAGNOSIS — G4482 Headache associated with sexual activity: Secondary | ICD-10-CM

## 2014-11-07 ENCOUNTER — Other Ambulatory Visit: Payer: Self-pay

## 2014-11-09 ENCOUNTER — Other Ambulatory Visit: Payer: Self-pay

## 2014-11-20 ENCOUNTER — Other Ambulatory Visit: Payer: Self-pay

## 2015-08-28 DIAGNOSIS — M542 Cervicalgia: Secondary | ICD-10-CM | POA: Diagnosis not present

## 2015-09-11 DIAGNOSIS — M4722 Other spondylosis with radiculopathy, cervical region: Secondary | ICD-10-CM | POA: Diagnosis not present

## 2015-09-16 DIAGNOSIS — M5412 Radiculopathy, cervical region: Secondary | ICD-10-CM | POA: Diagnosis not present

## 2015-09-20 DIAGNOSIS — M5020 Other cervical disc displacement, unspecified cervical region: Secondary | ICD-10-CM | POA: Diagnosis not present

## 2015-09-20 DIAGNOSIS — M50322 Other cervical disc degeneration at C5-C6 level: Secondary | ICD-10-CM | POA: Diagnosis not present

## 2015-09-20 DIAGNOSIS — M9973 Connective tissue and disc stenosis of intervertebral foramina of lumbar region: Secondary | ICD-10-CM | POA: Diagnosis not present

## 2015-10-09 IMAGING — CT CT ABD-PELV W/ CM
3 of 5 series · 13 of 36 positions shown, 19 images · IV contrast (READICAT/WATER & [ID] OMNI 300)
Comparison: CT ABD W/CM dated 01/06/2007; CT ABD W/CM dated
07/19/2003

CLINICAL DATA: Left lower quadrant pain. Bloating and constipation.
Prior surgery for diverticulitis. History of polio.

EXAM:
CT ABDOMEN AND PELVIS WITH CONTRAST
TECHNIQUE: Multidetector CT imaging of the abdomen and pelvis was performed
using the standard protocol following bolus administration of
intravenous contrast.
CONTRAST:  125mL OMNIPAQUE IOHEXOL 300 MG/ML  SOLN

[Series 3: abd/pelvis with · axial · 0.86mm/px · z∈[-380,-40]mm · 7 of 92 slices shown, 12 images]
[im 12/92  soft-tissue]
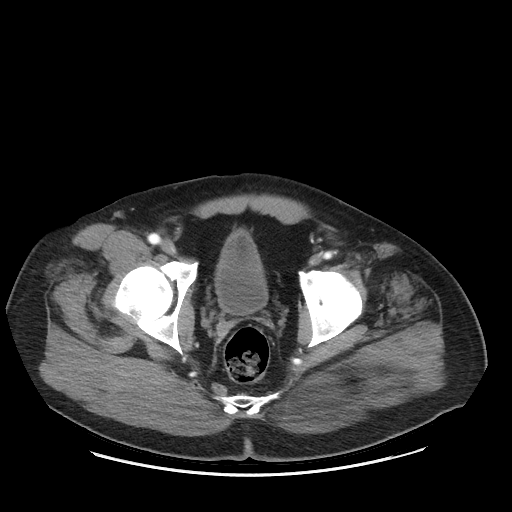
[im 12/92  bone]
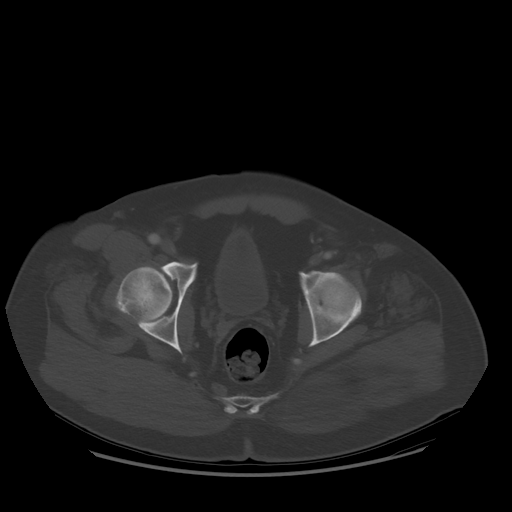
[im 23/92  soft-tissue]
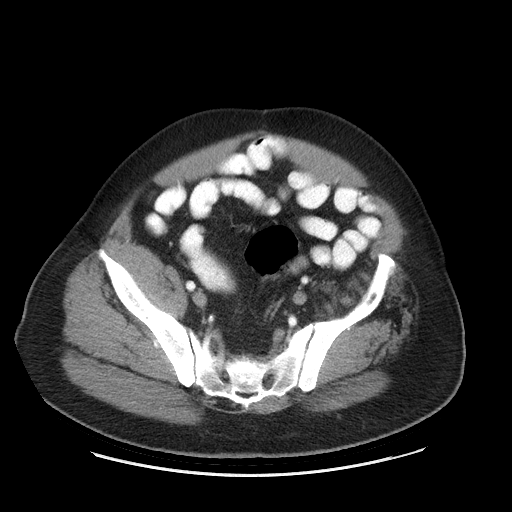
[im 35/92  soft-tissue]
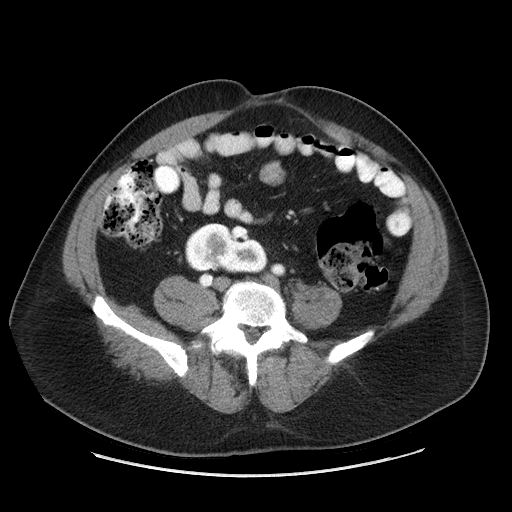
[im 46/92  soft-tissue]
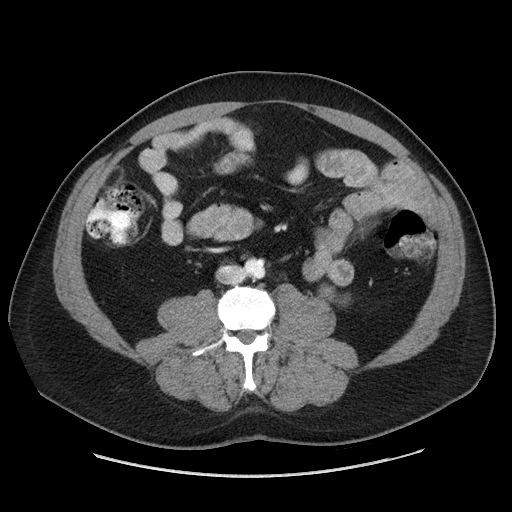
[im 46/92  lung]
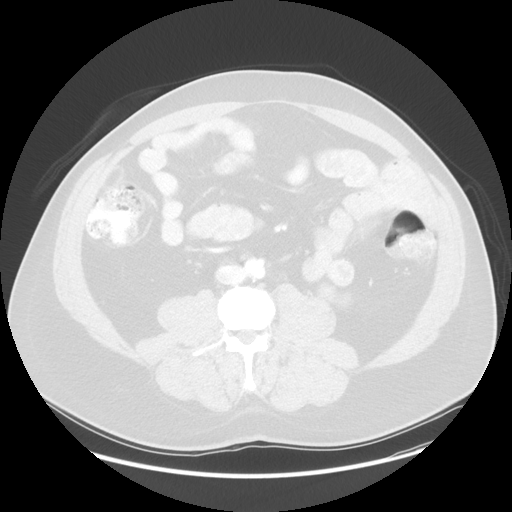
[im 57/92  soft-tissue]
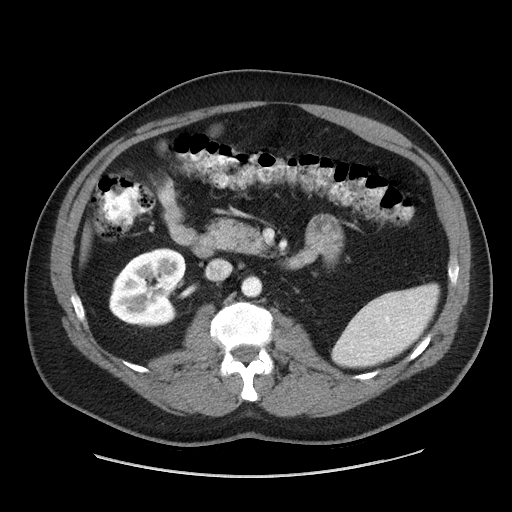
[im 57/92  lung]
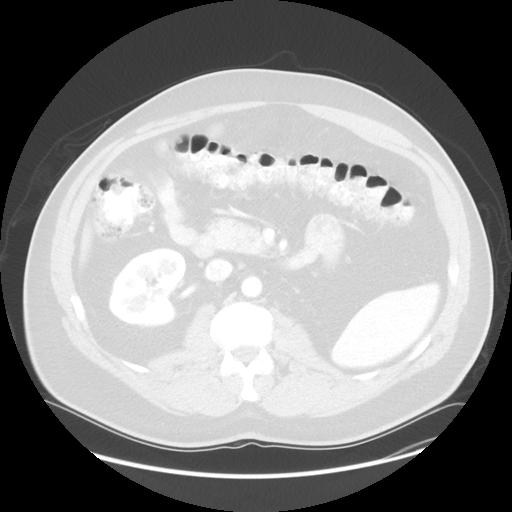
[im 69/92  soft-tissue]
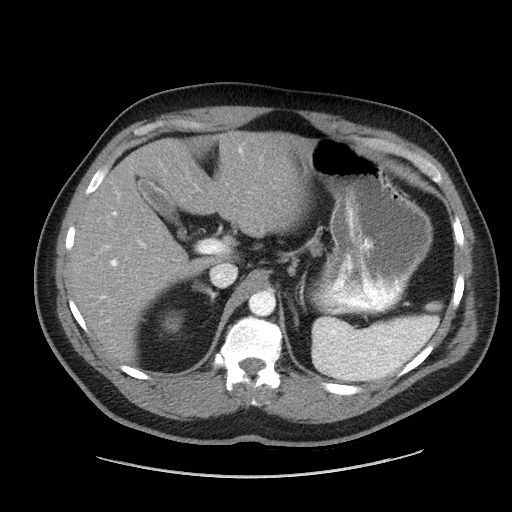
[im 69/92  lung]
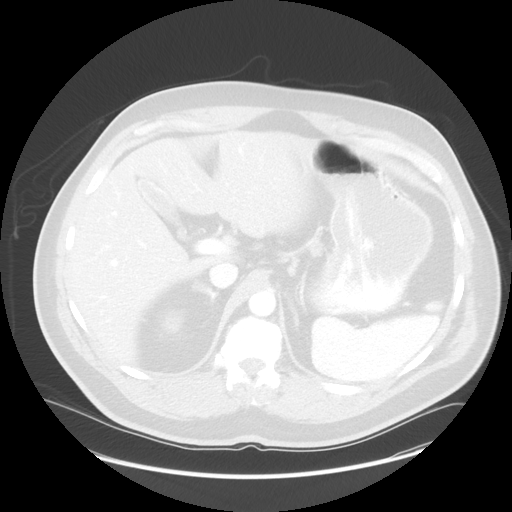
[im 80/92  soft-tissue]
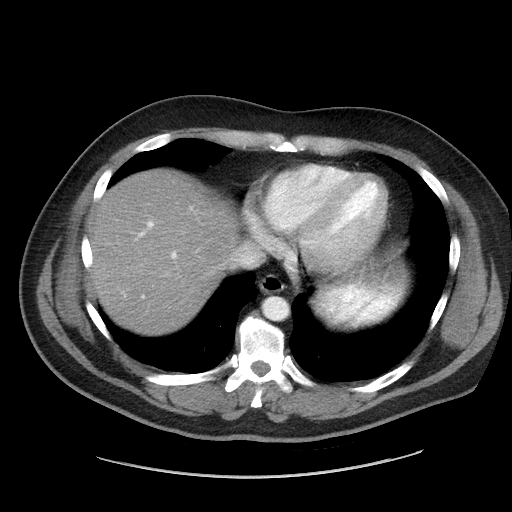
[im 80/92  lung]
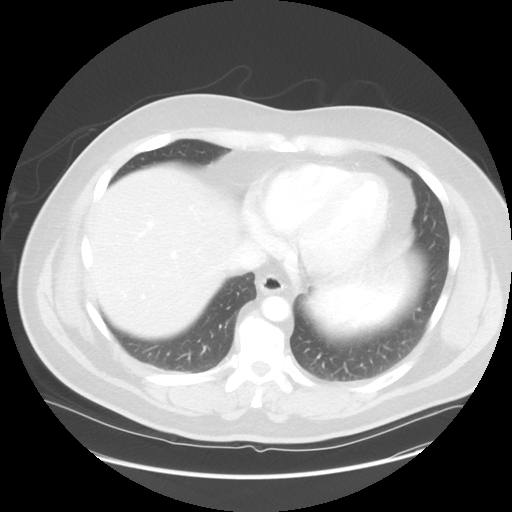

[Series 601: coronal body · coronal · 0.89mm/px · 1 of 138 slices shown, 2 images]
[im 46/138  soft-tissue]
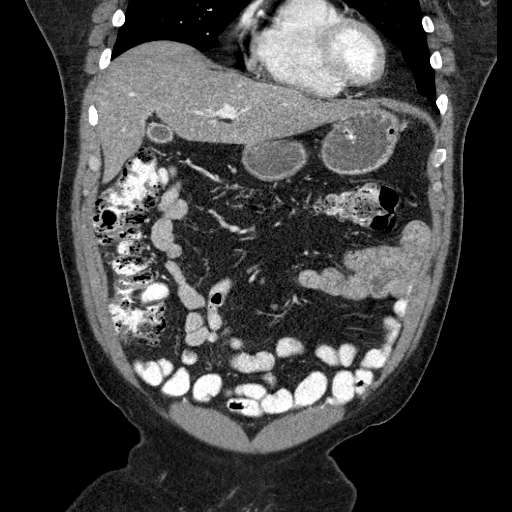
[im 46/138  bone]
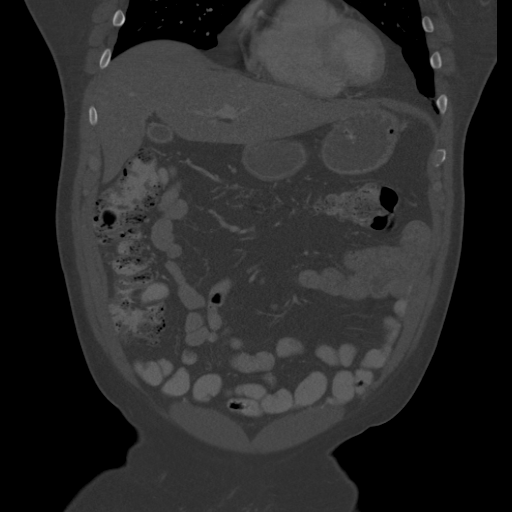

[Series 602: sagittal body · sagittal · 0.89mm/px · 5 of 177 slices shown]
[im 12/177  soft-tissue]
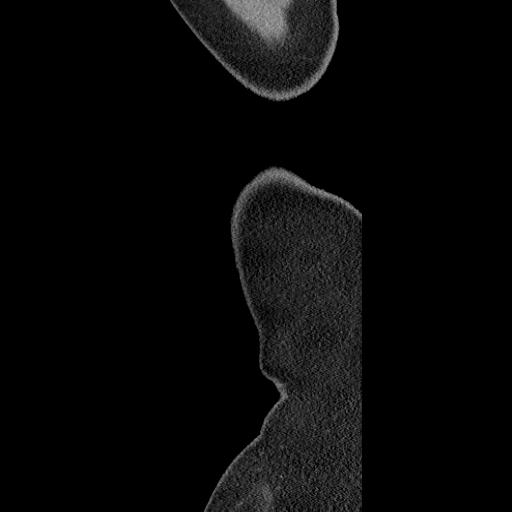
[im 36/177  soft-tissue]
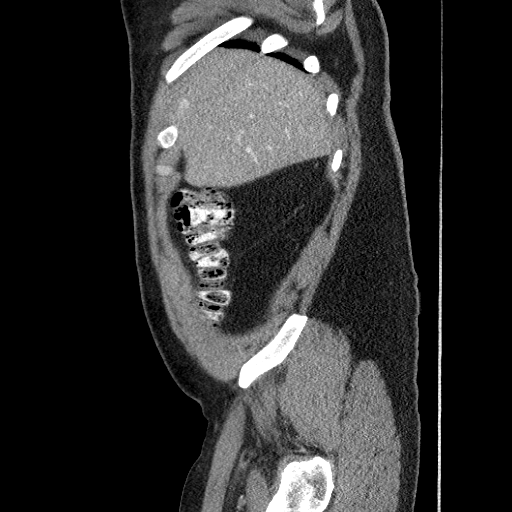
[im 59/177  soft-tissue]
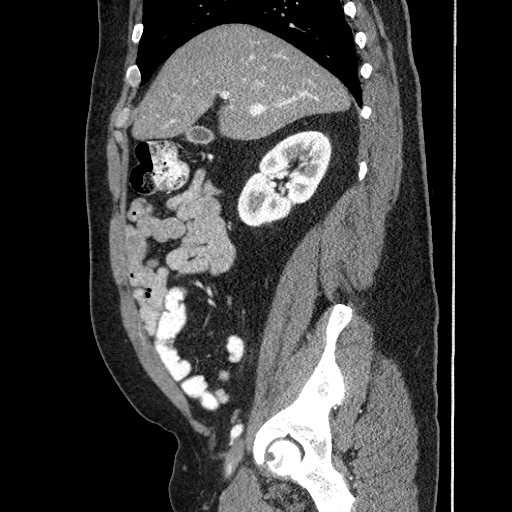
[im 83/177  soft-tissue]
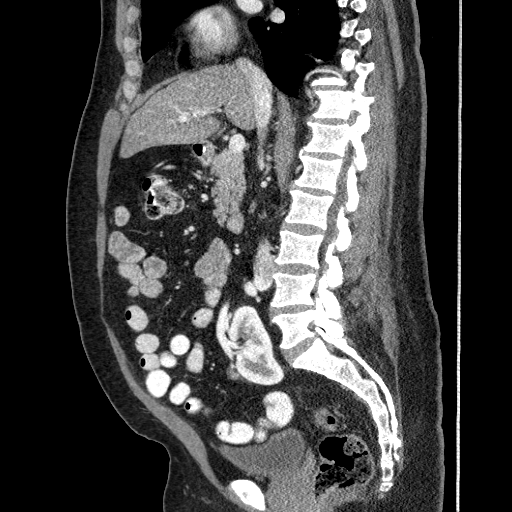
[im 94/177  soft-tissue]
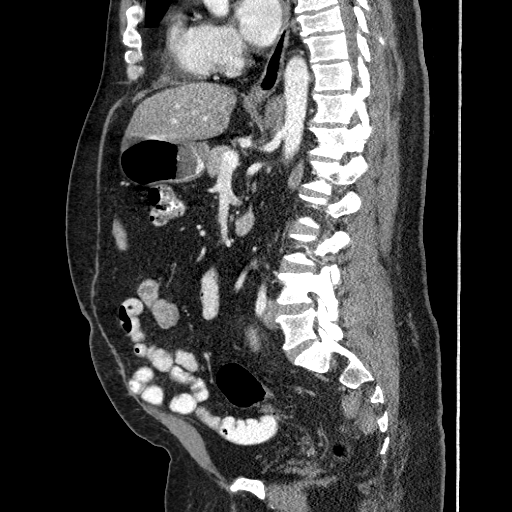

[13 of 36 positions shown; findings below may reference images not displayed]

FINDINGS: Lower Chest: Clear lung bases. Normal heart size without pericardial
or pleural effusion. LAD coronary artery atherosclerosis on image 1/
series 3.

Abdomen/Pelvis: Moderate hepatic steatosis with sparing adjacent the
gallbladder. No focal liver lesion. Normal spleen, stomach,
pancreas, gallbladder, biliary tract, adrenal glands, right kidney.
The left kidney is again ectopic , positioned within the high right
paracentral pelvis. No hydronephrosis or other acute complication.

Age advanced aortic and branch vessel atherosclerosis. No
retroperitoneal or retrocrural adenopathy. Surgical changes in the
descending/sigmoid colon junction. Normal terminal ileum and
appendix. Normal small bowel without abdominal ascites. No pelvic
adenopathy.

Bones/Musculoskeletal: Fatty atrophy of the left pelvic and lateral
lower extremity musculature. No acute osseous abnormality.
IMPRESSION: 1.  No acute process in the abdomen or pelvis.
2. Hepatic steatosis.
3. Age advanced coronary artery atherosclerosis. Recommend
assessment of coronary risk factors and consideration of medical
therapy.
4. Similar appearance of ectopic left kidney, positioned within the
right paracentral high pelvis.

## 2015-10-30 DIAGNOSIS — G14 Postpolio syndrome: Secondary | ICD-10-CM | POA: Diagnosis not present

## 2015-10-30 DIAGNOSIS — Z125 Encounter for screening for malignant neoplasm of prostate: Secondary | ICD-10-CM | POA: Diagnosis not present

## 2015-10-30 DIAGNOSIS — E7800 Pure hypercholesterolemia, unspecified: Secondary | ICD-10-CM | POA: Diagnosis not present

## 2015-10-30 DIAGNOSIS — Z0000 Encounter for general adult medical examination without abnormal findings: Secondary | ICD-10-CM | POA: Diagnosis not present

## 2015-10-30 DIAGNOSIS — Z114 Encounter for screening for human immunodeficiency virus [HIV]: Secondary | ICD-10-CM | POA: Diagnosis not present

## 2015-10-30 DIAGNOSIS — R7303 Prediabetes: Secondary | ICD-10-CM | POA: Diagnosis not present

## 2015-10-30 DIAGNOSIS — F419 Anxiety disorder, unspecified: Secondary | ICD-10-CM | POA: Diagnosis not present

## 2016-06-30 DIAGNOSIS — Z8719 Personal history of other diseases of the digestive system: Secondary | ICD-10-CM | POA: Diagnosis not present

## 2016-06-30 DIAGNOSIS — Z86010 Personal history of colonic polyps: Secondary | ICD-10-CM | POA: Diagnosis not present

## 2017-04-25 ENCOUNTER — Emergency Department (HOSPITAL_COMMUNITY)
Admission: EM | Admit: 2017-04-25 | Discharge: 2017-04-26 | Disposition: A | Discharge status: Home / Self Care | Payer: BLUE CROSS/BLUE SHIELD | Attending: Emergency Medicine | Admitting: Emergency Medicine

## 2017-04-25 ENCOUNTER — Emergency Department (HOSPITAL_COMMUNITY): Payer: BLUE CROSS/BLUE SHIELD

## 2017-04-25 ENCOUNTER — Other Ambulatory Visit: Payer: Self-pay

## 2017-04-25 ENCOUNTER — Encounter (HOSPITAL_COMMUNITY): Payer: Self-pay | Admitting: Emergency Medicine

## 2017-04-25 DIAGNOSIS — R739 Hyperglycemia, unspecified: Secondary | ICD-10-CM

## 2017-04-25 DIAGNOSIS — R631 Polydipsia: Secondary | ICD-10-CM | POA: Diagnosis not present

## 2017-04-25 DIAGNOSIS — Z79899 Other long term (current) drug therapy: Secondary | ICD-10-CM | POA: Diagnosis not present

## 2017-04-25 DIAGNOSIS — E860 Dehydration: Secondary | ICD-10-CM | POA: Diagnosis not present

## 2017-04-25 DIAGNOSIS — J0100 Acute maxillary sinusitis, unspecified: Secondary | ICD-10-CM | POA: Diagnosis not present

## 2017-04-25 DIAGNOSIS — Z7982 Long term (current) use of aspirin: Secondary | ICD-10-CM | POA: Diagnosis not present

## 2017-04-25 DIAGNOSIS — E118 Type 2 diabetes mellitus with unspecified complications: Secondary | ICD-10-CM | POA: Diagnosis not present

## 2017-04-25 DIAGNOSIS — R358 Other polyuria: Secondary | ICD-10-CM | POA: Diagnosis not present

## 2017-04-25 LAB — HEPATIC FUNCTION PANEL
ALK PHOS: 61 U/L (ref 38–126)
ALT: 51 U/L (ref 17–63)
AST: 43 U/L — ABNORMAL HIGH (ref 15–41)
Albumin: 4 g/dL (ref 3.5–5.0)
BILIRUBIN DIRECT: 0.4 mg/dL (ref 0.1–0.5)
Indirect Bilirubin: 0.6 mg/dL (ref 0.3–0.9)
TOTAL PROTEIN: 7.5 g/dL (ref 6.5–8.1)
Total Bilirubin: 1 mg/dL (ref 0.3–1.2)

## 2017-04-25 LAB — BASIC METABOLIC PANEL
ANION GAP: 10 (ref 5–15)
BUN: 13 mg/dL (ref 6–20)
CO2: 19 mmol/L — AB (ref 22–32)
Calcium: 9.1 mg/dL (ref 8.9–10.3)
Chloride: 101 mmol/L (ref 101–111)
Creatinine, Ser: 0.7 mg/dL (ref 0.61–1.24)
GFR calc Af Amer: >60 mL/min (ref >60)
GFR calc non Af Amer: >60 mL/min (ref >60)
GLUCOSE: 317 mg/dL — AB (ref 65–99)
Potassium: 4.9 mmol/L (ref 3.5–5.1)
Sodium: 130 mmol/L — ABNORMAL LOW (ref 135–145)

## 2017-04-25 LAB — LIPASE, BLOOD: LIPASE: 35 U/L (ref 11–51)

## 2017-04-25 LAB — URINALYSIS, ROUTINE W REFLEX MICROSCOPIC
BACTERIA UA: NONE SEEN
Bilirubin Urine: NEGATIVE
Glucose, UA: >=500 mg/dL — AB
Hgb urine dipstick: NEGATIVE
Ketones, ur: 5 mg/dL — AB
Leukocytes, UA: NEGATIVE
Nitrite: NEGATIVE
PROTEIN: NEGATIVE mg/dL
SQUAMOUS EPITHELIAL / LPF: NONE SEEN
Specific Gravity, Urine: 1.005 (ref 1.005–1.030)
pH: 5 (ref 5.0–8.0)

## 2017-04-25 LAB — CBC
HEMATOCRIT: 47.1 % (ref 39.0–52.0)
HEMOGLOBIN: 16.8 g/dL (ref 13.0–17.0)
MCH: 29.2 pg (ref 26.0–34.0)
MCHC: 35.7 g/dL (ref 30.0–36.0)
MCV: 81.8 fL (ref 78.0–100.0)
Platelets: 168 10*3/uL (ref 150–400)
RBC: 5.76 MIL/uL (ref 4.22–5.81)
RDW: 12.8 % (ref 11.5–15.5)
WBC: 6.7 10*3/uL (ref 4.0–10.5)

## 2017-04-25 LAB — CBG MONITORING, ED
Glucose-Capillary: 351 mg/dL — ABNORMAL HIGH (ref 65–99)
Glucose-Capillary: 196 mg/dL — ABNORMAL HIGH (ref 65–99)

## 2017-04-25 LAB — TROPONIN I: Troponin I: <0.03 ng/mL (ref <0.03)

## 2017-04-25 MED ORDER — AZITHROMYCIN 250 MG PO TABS: 250.0000 mg | ORAL_TABLET | Freq: Every day | ORAL | 0 refills | Status: AC

## 2017-04-25 MED ORDER — METFORMIN HCL 500 MG PO TABS: 500.0000 mg | ORAL_TABLET | Freq: Two times a day (BID) | ORAL | 0 refills | Status: DC

## 2017-04-25 MED ORDER — LORATADINE 10 MG PO TABS: 10.0000 mg | ORAL_TABLET | Freq: Every day | ORAL | 0 refills | Status: AC

## 2017-04-25 MED ORDER — FLUTICASONE PROPIONATE 50 MCG/ACT NA SUSP: 2.0000 | Freq: Every day | NASAL | 0 refills | Status: AC

## 2017-04-25 MED ORDER — SODIUM CHLORIDE 0.9 % IV BOLUS (SEPSIS)
1000.0000 mL | Freq: Once | INTRAVENOUS | Status: AC
  Administered 2017-04-25: 1000 mL via INTRAVENOUS

## 2017-04-25 MED ORDER — INSULIN ASPART 100 UNIT/ML IV SOLN
5.0000 [IU] | Freq: Once | INTRAVENOUS | Status: AC
  Administered 2017-04-25: 5 [IU] via INTRAVENOUS
  Filled 2017-04-25: qty 0.05

## 2017-04-25 NOTE — ED Provider Notes (Signed)
[facility]-EMERGENCY DEPT Provider Note   CSN: [id number] Arrival date & time: 04/25/17  1634     History   Chief Complaint Chief Complaint  Patient presents with  . Hyperglycemia    HPI Clayton Thompson is a 51 y.o. male.  HPI Patient presents with 2 weeks of polyuria, polydipsia probably generalized weakness lightheadedness, occasional blurred vision, and paresthesias.  States he was diagnosed with "prediabetes" last year but did not follow-up with his primary physician.  He takes small dose of Xanax periodically for anxiety.  Denies taking any other medication. Past Medical History:  Diagnosis Date  . Diverticulitis large intestine   . Polio     Patient Active Problem List   Diagnosis Date Noted  . Abnormal nuclear cardiac imaging test 03/28/2014  . Chest pain 03/27/2014  . Abnormal cardiovascular stress test 03/27/2014  . Coronary artery calcification seen on CAT scan 03/27/2014    Past Surgical History:  Procedure Laterality Date  . EYE SURGERY    . LEFT HEART CATHETERIZATION WITH CORONARY ANGIOGRAM N/A 03/28/2014   Procedure: LEFT HEART CATHETERIZATION WITH CORONARY ANGIOGRAM;  Surgeon: Micheline ChapmanMichael D Cooper, MD;  [facility];  Service: Cardiovascular;  Laterality: N/A;       Home Medications    Prior to Admission medications   Medication Sig Start Date End Date Taking? Authorizing Provider  ALPRAZolam Prudy Feeler(XANAX) 0.25 MG tablet Take 0.25 mg by mouth daily as needed for anxiety.  02/19/14  Yes [provider]  aspirin EC 81 MG tablet Take 81 mg by mouth daily.   Yes [provider]  azithromycin (ZITHROMAX) 250 MG tablet Take 1 tablet (250 mg total) by mouth daily. Take first 2 tablets together, then 1 every day until finished. 04/25/17   Loren RacerYelverton, Xue Low, MD  fluticasone (FLONASE) 50 MCG/ACT nasal spray Place 2 sprays into both nostrils daily. 04/25/17   Loren RacerYelverton, Burns Timson, MD  loratadine (CLARITIN) 10 MG tablet Take 1  tablet (10 mg total) by mouth daily. 04/25/17   Loren RacerYelverton, Eulah Walkup, MD  metFORMIN (GLUCOPHAGE) 500 MG tablet Take 1 tablet (500 mg total) by mouth 2 (two) times daily with a meal. 04/25/17   Loren RacerYelverton, Janete Quilling, MD    Family History Family History  Problem Relation Age of Onset  . Lupus Mother   . Arthritis Father   . Atrial fibrillation Father   . Arrhythmia Father   . Hyperlipidemia Brother   . Stroke Maternal Grandmother     Social History Social History   Tobacco Use  . Smoking status: Former Smoker  Substance Use Topics  . Alcohol use: Yes    Alcohol/week: 0.0 oz  . Drug use: No     Allergies   Patient has no known allergies.   Review of Systems Review of Systems  Constitutional: Positive for fatigue. Negative for chills and fever.  HENT: Positive for congestion, postnasal drip, sinus pressure and sinus pain. Negative for trouble swallowing and voice change.   Eyes: Negative for photophobia and visual disturbance.  Respiratory: Negative for cough and shortness of breath.   Cardiovascular: Negative for chest pain, palpitations and leg swelling.  Gastrointestinal: Negative for abdominal pain, constipation, diarrhea, nausea and vomiting.  Genitourinary: Positive for frequency. Negative for dysuria and flank pain.  Musculoskeletal: Negative for back pain, myalgias and neck pain.  Skin: Negative for rash and wound.  Neurological: Positive for light-headedness and headaches. Negative for weakness and numbness.  All other systems reviewed and are negative.  Physical Exam Updated Vital Signs BP 130/68   Pulse 64   Temp 98 F (36.7 C) (Oral)   Resp 19   Ht 5\' 7"  (1.702 m)   Wt 93.2 kg (205 lb 6.4 oz)   SpO2 98%   BMI 32.17 kg/m   Physical Exam  Constitutional: He is oriented to person, place, and time. He appears well-developed and well-nourished. No distress.  HENT:  Head: Normocephalic and atraumatic.  Mouth/Throat: Oropharynx is clear and moist. No  oropharyngeal exudate.  Bilateral nasal mucosal edema.  Patient also has bilateral maxillary sinus tenderness to percussion.  Oropharynx is mildly erythematous without tonsillar exudates.  Eyes: EOM are normal. Pupils are equal, round, and reactive to light.  Neck: Normal range of motion. Neck supple.  Cardiovascular: Normal rate and regular rhythm. Exam reveals no gallop and no friction rub.  No murmur heard. Pulmonary/Chest: Effort normal. He has wheezes.  Few scattered expiratory wheezes  Abdominal: Soft. Bowel sounds are normal. There is no tenderness. There is no rebound and no guarding.  Musculoskeletal: Normal range of motion. He exhibits no edema or tenderness.  No lower extremity swelling, asymmetry or tenderness.  Distal pulses are 2+.  Lymphadenopathy:    He has no cervical adenopathy.  Neurological: He is alert and oriented to person, place, and time.  Patient is alert and oriented x3 with clear, goal oriented speech. Patient has 5/5 motor in all extremities. Sensation is intact to light touch. Bilateral finger-to-nose is normal with no signs of dysmetria.   Skin: Skin is warm and dry. Capillary refill takes less than 2 seconds. No rash noted. No erythema.  Psychiatric: He has a normal mood and affect. His behavior is normal.  Nursing note and vitals reviewed.    ED Treatments / Results  Labs (all labs ordered are listed, but only abnormal results are displayed) Labs Reviewed  BASIC METABOLIC PANEL - Abnormal; Notable for the following components:      Result Value   Sodium 130 (*)    CO2 19 (*)    Glucose, Bld 317 (*)    All other components within normal limits  URINALYSIS, ROUTINE W REFLEX MICROSCOPIC - Abnormal; Notable for the following components:   Color, Urine STRAW (*)    Glucose, UA >=500 (*)    Ketones, ur 5 (*)    All other components within normal limits  HEPATIC FUNCTION PANEL - Abnormal; Notable for the following components:   AST 43 (*)    All other  components within normal limits  CBG MONITORING, ED - Abnormal; Notable for the following components:   Glucose-Capillary 351 (*)    All other components within normal limits  CBC  LIPASE, BLOOD  TROPONIN I  CBG MONITORING, ED  CBG MONITORING, ED    EKG  EKG Interpretation None       Radiology Dg Chest 2 View  Result Date: 04/25/2017 CLINICAL DATA:  Polyuria.  Pre diabetes. EXAM: CHEST  2 VIEW COMPARISON:  January 08, 2011 FINDINGS: The heart size and mediastinal contours are within normal limits. Both lungs are clear. The visualized skeletal structures are unremarkable. IMPRESSION: No active cardiopulmonary disease. Electronically Signed   By: Gerome Sam III M.D   On: 04/25/2017 21:52    Procedures Procedures (including critical care time)  Medications Ordered in ED Medications  sodium chloride 0.9 % bolus 1,000 mL (0 mLs Intravenous Stopped 04/25/17 2310)  insulin aspart (novoLOG) injection 5 Units (5 Units Intravenous Given 04/25/17 2137)  Initial Impression / Assessment and Plan / ED Course  I have reviewed the triage vital signs and the nursing notes.  Pertinent labs & imaging results that were available during my care of the patient were reviewed by me and considered in my medical decision making (see chart for details).     Patient is well-appearing.  Will start on metformin and have follow-up closely with his primary physician.  We will also treat for sinus infection.  Return precautions given.  Final Clinical Impressions(s) / ED Diagnoses   Final diagnoses:  Hyperglycemia  Dehydration  Acute maxillary sinusitis, recurrence not specified    ED Discharge Orders        Ordered    metFORMIN (GLUCOPHAGE) 500 MG tablet  2 times daily with meals     04/25/17 2332    loratadine (CLARITIN) 10 MG tablet  Daily     04/25/17 2332    fluticasone (FLONASE) 50 MCG/ACT nasal spray  Daily     04/25/17 2332    azithromycin (ZITHROMAX) 250 MG tablet  Daily      04/25/17 2332       Loren RacerYelverton, Cayla Wiegand, MD 04/25/17 2334

## 2017-04-25 NOTE — ED Notes (Signed)
ED Provider at bedside. 

## 2017-04-25 NOTE — ED Triage Notes (Addendum)
Pt reports he has had pre-diabetes for the past year. Did not follow up with PCP back in August. For the past 2 weeks he has had polyuria, polydipsia, weakness, head congestion, bilateral blurred vision, and tingling. Pt reports when he got ahold of a new CBG meter it showed his glucose was in the 400s

## 2017-04-29 DIAGNOSIS — E669 Obesity, unspecified: Secondary | ICD-10-CM | POA: Diagnosis not present

## 2017-04-29 DIAGNOSIS — E7800 Pure hypercholesterolemia, unspecified: Secondary | ICD-10-CM | POA: Diagnosis not present

## 2017-04-29 DIAGNOSIS — E119 Type 2 diabetes mellitus without complications: Secondary | ICD-10-CM | POA: Diagnosis not present

## 2017-04-29 DIAGNOSIS — Z209 Contact with and (suspected) exposure to unspecified communicable disease: Secondary | ICD-10-CM | POA: Diagnosis not present

## 2017-04-29 DIAGNOSIS — Z23 Encounter for immunization: Secondary | ICD-10-CM | POA: Diagnosis not present

## 2017-05-05 DIAGNOSIS — E119 Type 2 diabetes mellitus without complications: Secondary | ICD-10-CM | POA: Diagnosis not present

## 2017-05-11 DIAGNOSIS — Z87891 Personal history of nicotine dependence: Secondary | ICD-10-CM | POA: Diagnosis not present

## 2017-05-11 DIAGNOSIS — Z7982 Long term (current) use of aspirin: Secondary | ICD-10-CM | POA: Diagnosis not present

## 2017-05-11 DIAGNOSIS — Z7984 Long term (current) use of oral hypoglycemic drugs: Secondary | ICD-10-CM | POA: Diagnosis not present

## 2017-05-11 DIAGNOSIS — E119 Type 2 diabetes mellitus without complications: Secondary | ICD-10-CM | POA: Diagnosis not present

## 2017-05-11 DIAGNOSIS — I10 Essential (primary) hypertension: Secondary | ICD-10-CM | POA: Diagnosis not present

## 2017-06-30 ENCOUNTER — Ambulatory Visit: Payer: BLUE CROSS/BLUE SHIELD | Admitting: Endocrinology

## 2017-07-20 DIAGNOSIS — F419 Anxiety disorder, unspecified: Secondary | ICD-10-CM | POA: Diagnosis not present

## 2017-07-20 DIAGNOSIS — R079 Chest pain, unspecified: Secondary | ICD-10-CM | POA: Diagnosis not present

## 2017-07-20 DIAGNOSIS — E119 Type 2 diabetes mellitus without complications: Secondary | ICD-10-CM | POA: Diagnosis not present

## 2017-07-20 DIAGNOSIS — E7800 Pure hypercholesterolemia, unspecified: Secondary | ICD-10-CM | POA: Diagnosis not present

## 2017-07-20 DIAGNOSIS — Z125 Encounter for screening for malignant neoplasm of prostate: Secondary | ICD-10-CM | POA: Diagnosis not present

## 2017-07-20 DIAGNOSIS — E1165 Type 2 diabetes mellitus with hyperglycemia: Secondary | ICD-10-CM | POA: Diagnosis not present

## 2017-07-20 DIAGNOSIS — Z0001 Encounter for general adult medical examination with abnormal findings: Secondary | ICD-10-CM | POA: Diagnosis not present

## 2017-07-21 ENCOUNTER — Telehealth: Payer: Self-pay | Admitting: Cardiovascular Disease

## 2017-07-21 NOTE — Telephone Encounter (Signed)
[facility] 08/16/17 at 8:45 am. [place]

## 2017-08-16 ENCOUNTER — Ambulatory Visit: Payer: BLUE CROSS/BLUE SHIELD | Admitting: Cardiovascular Disease

## 2017-08-17 ENCOUNTER — Encounter: Payer: Self-pay | Admitting: *Deleted

## 2018-01-18 DIAGNOSIS — E119 Type 2 diabetes mellitus without complications: Secondary | ICD-10-CM | POA: Diagnosis not present

## 2018-01-18 DIAGNOSIS — G14 Postpolio syndrome: Secondary | ICD-10-CM | POA: Diagnosis not present

## 2018-01-18 DIAGNOSIS — I2510 Atherosclerotic heart disease of native coronary artery without angina pectoris: Secondary | ICD-10-CM | POA: Diagnosis not present

## 2018-01-18 DIAGNOSIS — E7800 Pure hypercholesterolemia, unspecified: Secondary | ICD-10-CM | POA: Diagnosis not present

## 2018-07-18 DIAGNOSIS — B078 Other viral warts: Secondary | ICD-10-CM | POA: Diagnosis not present

## 2018-07-18 DIAGNOSIS — L570 Actinic keratosis: Secondary | ICD-10-CM | POA: Diagnosis not present

## 2018-07-18 DIAGNOSIS — X32XXXD Exposure to sunlight, subsequent encounter: Secondary | ICD-10-CM | POA: Diagnosis not present

## 2019-03-01 DIAGNOSIS — J069 Acute upper respiratory infection, unspecified: Secondary | ICD-10-CM | POA: Diagnosis not present

## 2019-08-27 IMAGING — CR DG CHEST 2V
2 series · 2 of 2 positions shown · non-contrast
Comparison: January 08, 2011

CLINICAL DATA: Polyuria.  Pre diabetes.

EXAM:
CHEST  2 VIEW

[w chest pa]
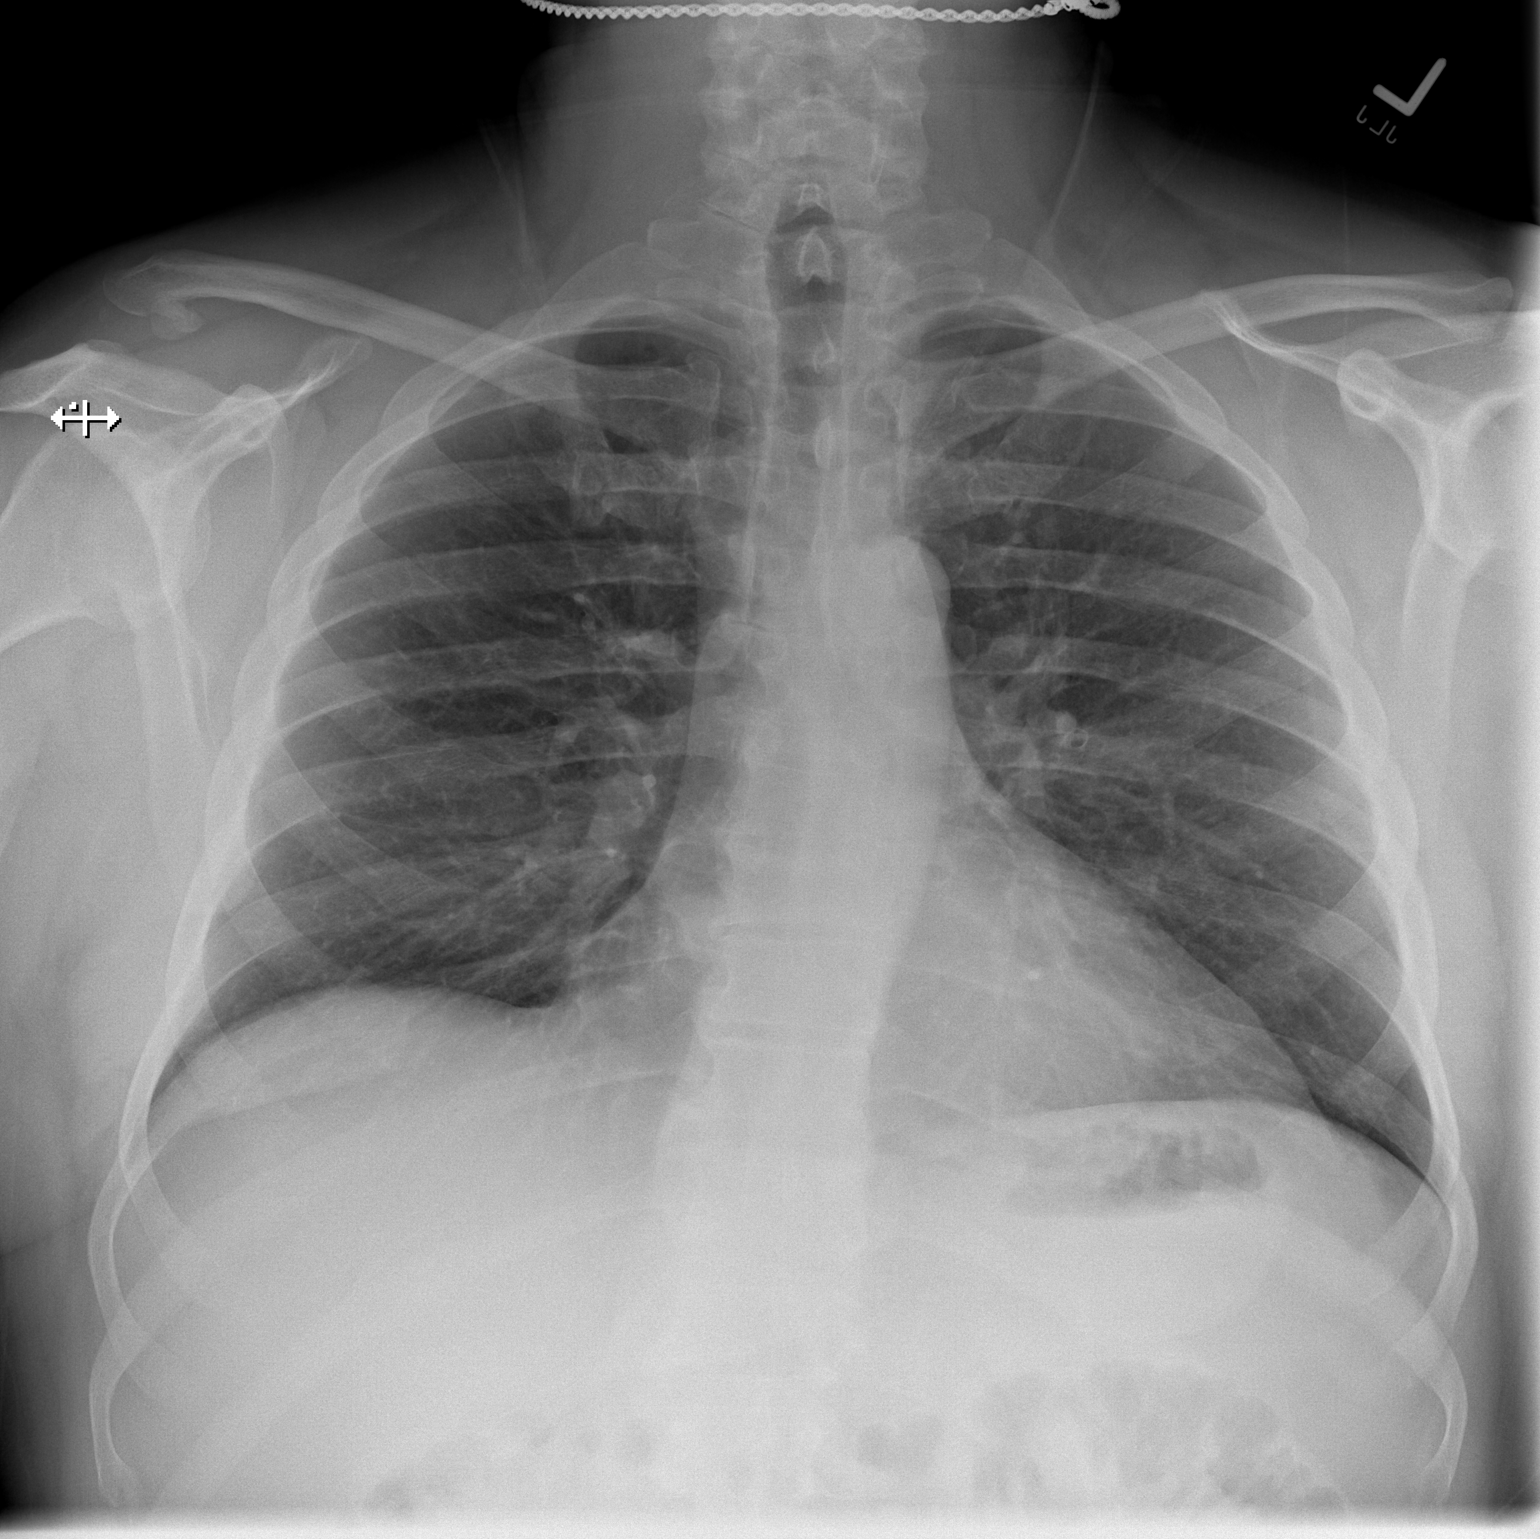

[w chest lat]
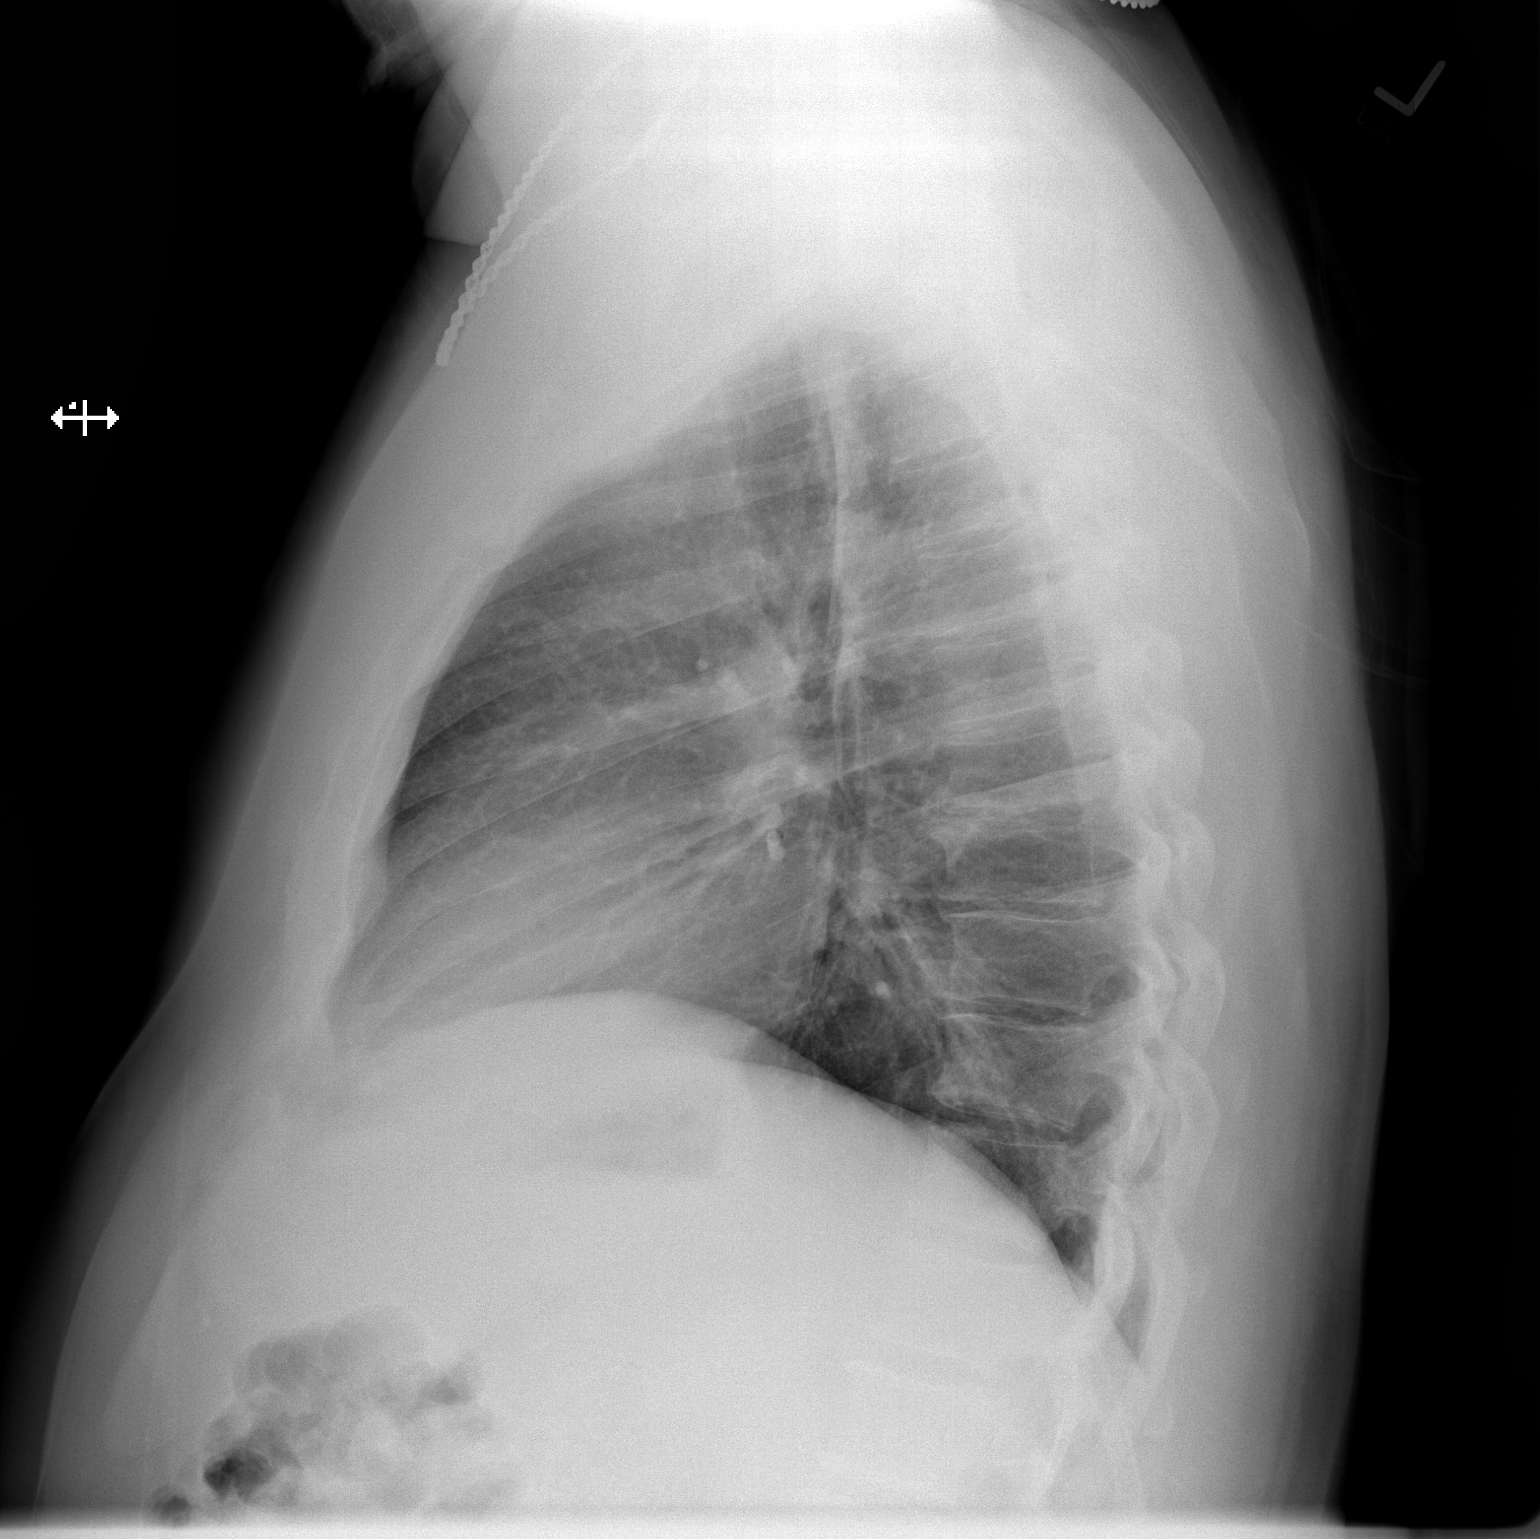

[2 of 2 positions shown; findings below may reference images not displayed]

FINDINGS: The heart size and mediastinal contours are within normal limits.
Both lungs are clear. The visualized skeletal structures are
unremarkable.
IMPRESSION: No active cardiopulmonary disease.

## 2020-12-15 ENCOUNTER — Other Ambulatory Visit: Payer: Self-pay

## 2020-12-15 ENCOUNTER — Ambulatory Visit
Admission: EM | Admit: 2020-12-15 | Discharge: 2020-12-15 | Disposition: A | Discharge status: Home / Self Care | Payer: 59 | Attending: Urgent Care | Admitting: Urgent Care

## 2020-12-15 ENCOUNTER — Encounter: Payer: Self-pay | Admitting: Emergency Medicine

## 2020-12-15 DIAGNOSIS — H6123 Impacted cerumen, bilateral: Secondary | ICD-10-CM

## 2020-12-15 MED ORDER — CARBAMIDE PEROXIDE 6.5 % OT SOLN: 5.0000 [drp] | Freq: Two times a day (BID) | OTIC | 0 refills | Status: AC

## 2020-12-15 NOTE — ED Triage Notes (Signed)
Pt sts bilateral ear fullness worse on left since swimming 2 days ago

## 2020-12-15 NOTE — ED Provider Notes (Addendum)
[facility]   MRN: [id number] DOB: Feb 06, 1966  Subjective:   Clayton Thompson is a 55 y.o. male presenting for 2-day history of acute onset left-sided ear fullness.  Patient states that he feels like his ear is clogged and has been using a lot of Q-tips to make sure that he gets the wax out.  He also tried peroxide.  Has a history of tinnitus which is unchanged.  Denies fever, ear pain, dizziness.  No current facility-administered medications for this encounter.  Current Outpatient Medications:    ALPRAZolam (XANAX) 0.25 MG tablet, Take 0.25 mg by mouth daily as needed for anxiety. , Disp: , Rfl: 0   aspirin EC 81 MG tablet, Take 81 mg by mouth daily., Disp: , Rfl:    azithromycin (ZITHROMAX) 250 MG tablet, Take 1 tablet (250 mg total) by mouth daily. Take first 2 tablets together, then 1 every day until finished. (Patient not taking: Reported on 12/15/2020), Disp: 6 tablet, Rfl: 0   fluticasone (FLONASE) 50 MCG/ACT nasal spray, Place 2 sprays into both nostrils daily., Disp: 16 g, Rfl: 0   loratadine (CLARITIN) 10 MG tablet, Take 1 tablet (10 mg total) by mouth daily., Disp: 30 tablet, Rfl: 0   Allergies  Allergen Reactions   Atorvastatin Calcium [Atorvastatin] Other (See Comments)    Muscle aches   Metformin Hcl Diarrhea and Other (See Comments)    Sight affected   Rosuvastatin Calcium [Rosuvastatin] Other (See Comments)    Joint Pain    Past Medical History:  Diagnosis Date   Diverticulitis large intestine    Polio      Past Surgical History:  Procedure Laterality Date   EYE SURGERY     LEFT HEART CATHETERIZATION WITH CORONARY ANGIOGRAM N/A 03/28/2014   Procedure: LEFT HEART CATHETERIZATION WITH CORONARY ANGIOGRAM;  Surgeon: Micheline Chapman, MD;  Location: [facility] CATH LAB;  Service: Cardiovascular;  Laterality: N/A;    Family History  Problem Relation Age of Onset   Lupus Mother    Arthritis Father    Atrial fibrillation Father    Arrhythmia Father     Hypertension Father    Hyperlipidemia Brother    Stroke Maternal Grandmother     Social History   Tobacco Use   Smoking status: Former  Substance Use Topics   Alcohol use: Yes    Alcohol/week: 0.0 standard drinks   Drug use: No    ROS   Objective:   Vitals: BP (!) 156/95 (BP Location: Left Arm)   Pulse 70   Temp 98.1 F (36.7 C) (Oral)   Resp 18   SpO2 95%   BP Readings from Last 3 Encounters:  12/15/20 (!) 156/95  04/26/17 136/72  03/28/14 (!) 149/83   Physical Exam Constitutional:      General: He is not in acute distress.    Appearance: Normal appearance. He is well-developed and normal weight. He is not ill-appearing, toxic-appearing or diaphoretic.  HENT:     Head: Normocephalic and atraumatic.     Right Ear: External ear normal. There is impacted cerumen.     Left Ear: External ear normal. There is impacted cerumen.     Nose: Nose normal.     Mouth/Throat:     Pharynx: Oropharynx is clear.  Eyes:     General: No scleral icterus.       Right eye: No discharge.        Left eye: No discharge.     Extraocular Movements: Extraocular movements intact.  Pupils: Pupils are equal, round, and reactive to light.  Cardiovascular:     Rate and Rhythm: Normal rate.  Pulmonary:     Effort: Pulmonary effort is normal.  Musculoskeletal:     Cervical back: Normal range of motion.  Neurological:     Mental Status: He is alert and oriented to person, place, and time.  Psychiatric:        Mood and Affect: Mood normal.        Behavior: Behavior normal.        Thought Content: Thought content normal.        Judgment: Judgment normal.    Ear lavage performed using mixture of peroxide and water.  Pressure irrigation performed using a bottle and a thin ear tube.  Bilateral ear lavage.  Curette was used as well for manual removal.  Assessment and Plan :   PDMP not reviewed this encounter.  1. Bilateral impacted cerumen     Attempted bilateral ear lavage.   Unfortunately we were not able to remove in its entirety as patient could no longer tolerate the ear lavage and curette use.  General management of cerumen impaction reviewed with patient.  Use Debrox, follow-up with [facility].  Counseled patient on potential for adverse effects with medications prescribed/recommended today, ER and return-to-clinic precautions discussed, patient verbalized understanding.     Wallis Bamberg, [place] 12/15/20 [phone]

## 2020-12-15 NOTE — Discharge Instructions (Addendum)
Please stop using Q-tips in your ears.  You can use the Debrox solution while you wait to be seen by the [facility] ear nose throat specialist for further intervention of your earwax.

## 2024-04-25 ENCOUNTER — Encounter (HOSPITAL_COMMUNITY): Payer: Self-pay | Admitting: Family Medicine

## 2024-04-25 DIAGNOSIS — R0789 Other chest pain: Secondary | ICD-10-CM

## 2024-05-12 NOTE — Progress Notes (Deleted)
" °  Cardiology Office Note:  .   Date:  05/12/2024  ID:  Clayton Thompson, DOB [birth year], MRN [id number] PCP: Clayton Thompson.[name], MD (Inactive)  [facility] Providers Cardiologist:  None   History of Present Illness: .   No chief complaint on file.   Clayton Thompson is a 58 y.o. male with below history who presents for the evaluation of chest pain at the request of Clayton Opal, DO.   History of Present Illness               Problem List DM -A1c 7.5 HTN HLD -T chol 199, TG 121, HDL 52, LDL 126    ROS: All other ROS reviewed and negative. Pertinent positives noted in the HPI.     Studies Reviewed: SABRA       Physical Exam:   VS:  There were no vitals taken for this visit.   Wt Readings from Last 3 Encounters:  04/25/17 205 lb 6.4 oz (93.2 kg)  03/28/14 (P) 225 lb (102.1 kg)  03/27/14 229 lb 3.2 oz (104 kg)    GEN: Well nourished, well developed in no acute distress NECK: No JVD; No carotid bruits CARDIAC: ***RRR, no murmurs, rubs, gallops RESPIRATORY:  Clear to auscultation without rales, wheezing or rhonchi  ABDOMEN: Soft, non-tender, non-distended EXTREMITIES:  No edema; No deformity  ASSESSMENT AND PLAN: .   Assessment and Plan                 {Are you ordering a CV Procedure (e.g. stress test, cath, DCCV, TEE, etc)?   Press F2        :[id number]}   Follow-up: No follow-ups on file.  Signed, Clayton Thompson. Barbaraann, MD, [facility]  [facility]  [address] [place], [place] [address] [phone]  1:37 PM   "

## 2024-05-15 DIAGNOSIS — Z125 Encounter for screening for malignant neoplasm of prostate: Secondary | ICD-10-CM | POA: Insufficient documentation

## 2024-05-15 DIAGNOSIS — N529 Male erectile dysfunction, unspecified: Secondary | ICD-10-CM | POA: Insufficient documentation

## 2024-05-15 DIAGNOSIS — F419 Anxiety disorder, unspecified: Secondary | ICD-10-CM | POA: Insufficient documentation

## 2024-05-15 DIAGNOSIS — G629 Polyneuropathy, unspecified: Secondary | ICD-10-CM | POA: Insufficient documentation

## 2024-05-17 ENCOUNTER — Ambulatory Visit: Admitting: Cardiovascular Disease

## 2024-05-29 NOTE — Progress Notes (Deleted)
 " Cardiology Office Note:    Date:  05/29/2024   ID:  Clayton Thompson, DOB 01-Jul-1965, MRN [id number]  PCP:  Merilee [email or web address].[name], MD (Inactive)   [facility] Providers Cardiologist:  None { Click to update primary MD,subspecialty MD or APP then REFRESH:1}    Referring MD: Auston Opal, DO   No chief complaint on file. ***  History of Present Illness:    Clayton Thompson is a 59 y.o. male is seen at the request of Dr Auston for evaluation of chest tightness. He has a history of HLD, DM type 2, obesity. He was seen remotely by cardiology in 2015. He had an abnormal Myoview leading to cardiac cath which showed mild to moderate nonobstructive CAD. Risk factor modification recommended.   Past Medical History:  Diagnosis Date   Anxiety    seasonal in the winter.   Diverticulitis large intestine    Hypercholesterolemia    Obesity    Osteoarthritis    Polio 1966   Left leg   Type 2 diabetes mellitus (HCC)     Past Surgical History:  Procedure Laterality Date   COLON SURGERY     EYE SURGERY     HEMICOLECTOMY Left 05/2008   Dr. Mikell   LEFT HEART CATHETERIZATION WITH CORONARY ANGIOGRAM N/A 03/28/2014   Procedure: LEFT HEART CATHETERIZATION WITH CORONARY ANGIOGRAM;  Surgeon: Ozell JONETTA Fell, MD;  Location: [facility] CATH LAB;  Service: Cardiovascular;  Laterality: N/A;   right knee growth plate N/A 8021    Current Medications: Active Medications[1]   Allergies:   Atorvastatin calcium [atorvastatin], Metformin  hcl, Pravastatin, and Rosuvastatin calcium [rosuvastatin]   Social History   Socioeconomic History   Marital status: Single    Spouse name: Not on file   Number of children: Not on file   Years of education: Not on file   Highest education level: Not on file  Occupational History   Not on file  Tobacco Use   Smoking status: Former    Current packs/day: 1.00    Average packs/day: 1 pack/day for 0.2 years (0.2 ttl pk-yrs)    Types: Cigarettes    Start date:  03/07/2024   Smokeless tobacco: Not on file  Substance and Sexual Activity   Alcohol use: Yes    Alcohol/week: 0.0 standard drinks of alcohol   Drug use: No   Sexual activity: Yes  Other Topics Concern   Not on file  Social History Narrative   Not on file   Social Drivers of Health   Tobacco Use: Medium Risk (05/12/2024)   Patient History    Smoking Tobacco Use: Former    Smokeless Tobacco Use: Unknown    Passive Exposure: Not on Actuary Strain: Not on file  Food Insecurity: Not on file  Transportation Needs: Not on file  Physical Activity: Not on file  Stress: Not on file  Social Connections: Not on file  Depression (EYV7-0): Not on file  Alcohol Screen: Not on file  Housing: Not on file  Utilities: Not on file  Health Literacy: Not on file     Family History: The patient's ***family history includes Arrhythmia in his father; Arthritis in his father; Atrial fibrillation in his father; Hyperlipidemia in his brother; Hypertension in his father; Lupus in his mother; Stroke in his maternal grandmother.  ROS:   Please see the history of present illness.    *** All other systems reviewed and are negative.  EKGs/Labs/Other Studies Reviewed:    The  following studies were reviewed today: ***      Recent Labs: No results found for requested labs within last 365 days.  Recent Lipid Panel No results found for: CHOL, TRIG, HDL, CHOLHDL, VLDL, LDLCALC, LDLDIRECT   Risk Assessment/Calculations:   {Does this patient have ATRIAL FIBRILLATION?:[id number]}  No BP recorded.  {Refresh Note OR Click here to enter BP  :1}***         Physical Exam:    VS:  There were no vitals taken for this visit.    Wt Readings from Last 3 Encounters:  04/25/17 205 lb 6.4 oz (93.2 kg)  03/28/14 (P) 225 lb (102.1 kg)  03/27/14 229 lb 3.2 oz (104 kg)     GEN: *** Well nourished, well developed in no acute distress HEENT: Normal NECK: No JVD; No carotid  bruits LYMPHATICS: No lymphadenopathy CARDIAC: ***RRR, no murmurs, rubs, gallops RESPIRATORY:  Clear to auscultation without rales, wheezing or rhonchi  ABDOMEN: Soft, non-tender, non-distended MUSCULOSKELETAL:  No edema; No deformity  SKIN: Warm and dry NEUROLOGIC:  Alert and oriented x 3 PSYCHIATRIC:  Normal affect   ASSESSMENT:    No diagnosis found. PLAN:    In order of problems listed above:  ***      {Are you ordering a CV Procedure (e.g. stress test, cath, DCCV, TEE, etc)?   Press F2        :[id number]}    Medication Adjustments/Labs and Tests Ordered: Current medicines are reviewed at length with the patient today.  Concerns regarding medicines are outlined above.  No orders of the defined types were placed in this encounter.  No orders of the defined types were placed in this encounter.   There are no Patient Instructions on file for this visit.   Signed, Nasiah Polinsky, MD  05/29/2024 7:50 AM    [facility]     [1]  No outpatient medications have been marked as taking for the 05/31/24 encounter (Appointment) with Kevia Zaucha M, MD.   "

## 2024-05-31 ENCOUNTER — Ambulatory Visit: Admitting: Cardiology

## 2024-06-02 ENCOUNTER — Encounter: Payer: Self-pay | Admitting: Cardiology

## 2024-07-06 ENCOUNTER — Ambulatory Visit: Admitting: Nurse Practitioner
